# Patient Record
Sex: Female | Born: 1960
Health system: Southern US, Community
[De-identification: ages and names within clinical notes are randomized; demographics above are authoritative.]

## PROBLEM LIST (undated history)

## (undated) DIAGNOSIS — IMO0002 Reserved for concepts with insufficient information to code with codable children: Secondary | ICD-10-CM

## (undated) DIAGNOSIS — Z9889 Other specified postprocedural states: Secondary | ICD-10-CM

## (undated) DIAGNOSIS — R87619 Unspecified abnormal cytological findings in specimens from cervix uteri: Secondary | ICD-10-CM

## (undated) DIAGNOSIS — K219 Gastro-esophageal reflux disease without esophagitis: Secondary | ICD-10-CM

## (undated) DIAGNOSIS — B019 Varicella without complication: Secondary | ICD-10-CM

## (undated) HISTORY — DX: Unspecified abnormal cytological findings in specimens from cervix uteri: R87.619

## (undated) HISTORY — PX: WISDOM TOOTH EXTRACTION: SHX21

## (undated) HISTORY — DX: Other specified postprocedural states: Z98.890

## (undated) HISTORY — DX: Reserved for concepts with insufficient information to code with codable children: IMO0002

## (undated) HISTORY — DX: Varicella without complication: B01.9

## (undated) HISTORY — DX: Gastro-esophageal reflux disease without esophagitis: K21.9

---

## 1994-12-15 HISTORY — PX: COLPOSCOPY: SHX161

## 1999-05-27 ENCOUNTER — Other Ambulatory Visit: Admission: RE | Admit: 1999-05-27 | Discharge: 1999-05-27 | Payer: Self-pay | Admitting: Family Medicine

## 1999-12-16 HISTORY — PX: BREAST SURGERY: SHX581

## 2000-05-28 ENCOUNTER — Other Ambulatory Visit: Admission: RE | Admit: 2000-05-28 | Discharge: 2000-05-28 | Payer: Self-pay | Admitting: Family Medicine

## 2000-12-15 DIAGNOSIS — Z9889 Other specified postprocedural states: Secondary | ICD-10-CM

## 2000-12-15 HISTORY — PX: LASIK: SHX215

## 2000-12-15 HISTORY — DX: Other specified postprocedural states: Z98.890

## 2001-06-14 HISTORY — PX: CERVICAL BIOPSY  W/ LOOP ELECTRODE EXCISION: SUR135

## 2001-06-16 ENCOUNTER — Other Ambulatory Visit: Admission: RE | Admit: 2001-06-16 | Discharge: 2001-06-16 | Payer: Self-pay | Admitting: Family Medicine

## 2001-07-14 ENCOUNTER — Other Ambulatory Visit: Admission: RE | Admit: 2001-07-14 | Discharge: 2001-07-14 | Payer: Self-pay | Admitting: Obstetrics and Gynecology

## 2001-07-14 HISTORY — PX: COLPOSCOPY: SHX161

## 2001-07-19 ENCOUNTER — Encounter: Admission: RE | Admit: 2001-07-19 | Discharge: 2001-07-19 | Payer: Self-pay | Admitting: Family Medicine

## 2001-07-19 ENCOUNTER — Encounter: Payer: Self-pay | Admitting: Family Medicine

## 2001-07-22 ENCOUNTER — Encounter: Payer: Self-pay | Admitting: Family Medicine

## 2001-07-22 ENCOUNTER — Encounter: Admission: RE | Admit: 2001-07-22 | Discharge: 2001-07-22 | Payer: Self-pay | Admitting: Family Medicine

## 2001-08-23 ENCOUNTER — Encounter: Admission: RE | Admit: 2001-08-23 | Discharge: 2001-08-23 | Payer: Self-pay | Admitting: Obstetrics and Gynecology

## 2001-08-23 ENCOUNTER — Encounter: Payer: Self-pay | Admitting: Obstetrics and Gynecology

## 2001-08-23 ENCOUNTER — Encounter: Payer: Self-pay | Admitting: Surgery

## 2001-08-23 ENCOUNTER — Encounter (INDEPENDENT_AMBULATORY_CARE_PROVIDER_SITE_OTHER): Payer: Self-pay | Admitting: Specialist

## 2001-08-23 ENCOUNTER — Ambulatory Visit (HOSPITAL_BASED_OUTPATIENT_CLINIC_OR_DEPARTMENT_OTHER): Admission: RE | Admit: 2001-08-23 | Discharge: 2001-08-23 | Payer: Self-pay | Admitting: Surgery

## 2001-08-30 HISTORY — PX: BREAST EXCISIONAL BIOPSY: SUR124

## 2001-11-10 ENCOUNTER — Other Ambulatory Visit: Admission: RE | Admit: 2001-11-10 | Discharge: 2001-11-10 | Payer: Self-pay | Admitting: Obstetrics and Gynecology

## 2002-02-16 ENCOUNTER — Other Ambulatory Visit: Admission: RE | Admit: 2002-02-16 | Discharge: 2002-02-16 | Payer: Self-pay | Admitting: Obstetrics and Gynecology

## 2002-06-24 ENCOUNTER — Other Ambulatory Visit: Admission: RE | Admit: 2002-06-24 | Discharge: 2002-06-24 | Payer: Self-pay | Admitting: Obstetrics and Gynecology

## 2002-08-25 ENCOUNTER — Encounter: Payer: Self-pay | Admitting: Obstetrics and Gynecology

## 2002-08-25 ENCOUNTER — Encounter: Admission: RE | Admit: 2002-08-25 | Discharge: 2002-08-25 | Payer: Self-pay | Admitting: Obstetrics and Gynecology

## 2002-12-21 ENCOUNTER — Other Ambulatory Visit: Admission: RE | Admit: 2002-12-21 | Discharge: 2002-12-21 | Payer: Self-pay | Admitting: Obstetrics and Gynecology

## 2003-06-26 ENCOUNTER — Other Ambulatory Visit: Admission: RE | Admit: 2003-06-26 | Discharge: 2003-06-26 | Payer: Self-pay | Admitting: Obstetrics and Gynecology

## 2003-08-29 ENCOUNTER — Encounter: Admission: RE | Admit: 2003-08-29 | Discharge: 2003-08-29 | Payer: Self-pay | Admitting: Obstetrics and Gynecology

## 2003-08-29 ENCOUNTER — Encounter: Payer: Self-pay | Admitting: Obstetrics and Gynecology

## 2004-06-26 ENCOUNTER — Other Ambulatory Visit: Admission: RE | Admit: 2004-06-26 | Discharge: 2004-06-26 | Payer: Self-pay | Admitting: Obstetrics and Gynecology

## 2004-08-29 ENCOUNTER — Encounter: Admission: RE | Admit: 2004-08-29 | Discharge: 2004-08-29 | Payer: Self-pay | Admitting: Obstetrics and Gynecology

## 2005-07-30 ENCOUNTER — Other Ambulatory Visit: Admission: RE | Admit: 2005-07-30 | Discharge: 2005-07-30 | Payer: Self-pay | Admitting: Obstetrics and Gynecology

## 2005-09-01 ENCOUNTER — Encounter: Admission: RE | Admit: 2005-09-01 | Discharge: 2005-09-01 | Payer: Self-pay | Admitting: Obstetrics and Gynecology

## 2006-08-21 ENCOUNTER — Other Ambulatory Visit: Admission: RE | Admit: 2006-08-21 | Discharge: 2006-08-21 | Payer: Self-pay | Admitting: Obstetrics and Gynecology

## 2006-09-15 ENCOUNTER — Encounter: Admission: RE | Admit: 2006-09-15 | Discharge: 2006-09-15 | Payer: Self-pay | Admitting: Obstetrics and Gynecology

## 2007-09-03 ENCOUNTER — Other Ambulatory Visit: Admission: RE | Admit: 2007-09-03 | Discharge: 2007-09-03 | Payer: Self-pay | Admitting: Unknown Physician Specialty

## 2007-09-22 ENCOUNTER — Encounter: Admission: RE | Admit: 2007-09-22 | Discharge: 2007-09-22 | Payer: Self-pay | Admitting: Obstetrics and Gynecology

## 2008-09-11 ENCOUNTER — Other Ambulatory Visit: Admission: RE | Admit: 2008-09-11 | Discharge: 2008-09-11 | Payer: Self-pay | Admitting: Obstetrics and Gynecology

## 2008-09-22 ENCOUNTER — Encounter: Admission: RE | Admit: 2008-09-22 | Discharge: 2008-09-22 | Payer: Self-pay | Admitting: Obstetrics and Gynecology

## 2009-09-25 ENCOUNTER — Encounter: Admission: RE | Admit: 2009-09-25 | Discharge: 2009-09-25 | Payer: Self-pay | Admitting: Obstetrics and Gynecology

## 2010-09-26 ENCOUNTER — Encounter: Admission: RE | Admit: 2010-09-26 | Discharge: 2010-09-26 | Payer: Self-pay | Admitting: Obstetrics and Gynecology

## 2011-05-02 NOTE — Op Note (Signed)
[place]  Patient:    Deborah Lang, Deborah Lang Visit Number: [id number] MRN: [id number]          Service Type: DSU Location: [facility] Attending Physician:  Charlton Haws Dictated by:   Currie Paris, M.D. Proc. Date: 08/23/01 Admit Date:  08/23/2001   CC:         Duncan Dull, M.D.  [facility]   Operative Report  ACCOUNT:  [id number]  OFFICE: [id number]  PREOPERATIVE DIAGNOSIS:  Breast calcifications, left.  POSTOPERATIVE DIAGNOSIS:  Breast calcifications, left.  OPERATION:  Needle local excision of left breast calcifications.  SURGEON:  Currie Paris, M.D.  ANESTHESIA:  General.  INDICATIONS:  This patient is a 50 year old with some indeterminate calcifications in the upper outer quadrant of the left breast.  They are not amenable to stereotactic biopsy.  DESCRIPTION OF PROCEDURE:  The patient was seen in the holding area and had had her guide wire placed.  It appeared to be appropriately placed.  It entered laterally in the upper outer quadrant of the left breast.  The patient was taken to the operating room, and after satisfactory general anesthesia was obtained, the breast was prepped and draped.  I made a curvilinear incision just above and medial to the guide wire which was the direction the guide wire was tracking.  I divided a little bit of the subcutaneous tissue, and then manipulated the guide wire into the wound.  I grasped the breast tissue around the guide wire, and using cautery excised a cylinder of tissue around the guide wire until I got beyond the tip.  It was actually fairly superficial in the breast, as it was close to the surface of the breast tissue per se.  Once this area was removed, it was sent for specimen mammography.  While waiting for that, the breast was closed with #3-0 Vicryl, followed by #4-0 Monocryl subcuticular.  I infiltrated a little 0.25% Marcaine to try to help with  postoperative pain relief.  Radiology reported that the calcifications were contained.  Once I had removed those calcifications, I did take another small little area that felt a little bit rough at the edge of the tissue, and sent that with the permanents.  The wound was covered with some Steri-Strips and a sterile dressing.  The patient tolerated the procedure well.  There were no operative complications.  All counts were correct. Dictated by:   Currie Paris, M.D. Attending Physician:  Charlton Haws DD:  08/23/01 TD:  08/23/01 Job: [id number]

## 2011-06-11 LAB — HM COLONOSCOPY

## 2011-08-21 ENCOUNTER — Other Ambulatory Visit: Payer: Self-pay | Admitting: Obstetrics and Gynecology

## 2011-08-21 DIAGNOSIS — Z1231 Encounter for screening mammogram for malignant neoplasm of breast: Secondary | ICD-10-CM

## 2011-10-02 ENCOUNTER — Ambulatory Visit
Admission: RE | Admit: 2011-10-02 | Discharge: 2011-10-02 | Disposition: A | Discharge status: Home / Self Care | Payer: 59 | Source: Ambulatory Visit | Attending: Obstetrics and Gynecology | Admitting: Obstetrics and Gynecology

## 2011-10-02 DIAGNOSIS — Z1231 Encounter for screening mammogram for malignant neoplasm of breast: Secondary | ICD-10-CM

## 2012-08-31 ENCOUNTER — Other Ambulatory Visit: Payer: Self-pay | Admitting: Obstetrics and Gynecology

## 2012-08-31 DIAGNOSIS — Z1231 Encounter for screening mammogram for malignant neoplasm of breast: Secondary | ICD-10-CM

## 2012-10-04 ENCOUNTER — Ambulatory Visit: Payer: 59

## 2012-10-08 ENCOUNTER — Ambulatory Visit
Admission: RE | Admit: 2012-10-08 | Discharge: 2012-10-08 | Disposition: A | Discharge status: Home / Self Care | Payer: 59 | Source: Ambulatory Visit | Attending: Obstetrics and Gynecology | Admitting: Obstetrics and Gynecology

## 2012-10-08 DIAGNOSIS — Z1231 Encounter for screening mammogram for malignant neoplasm of breast: Secondary | ICD-10-CM

## 2012-10-15 LAB — HM PAP SMEAR

## 2013-08-03 ENCOUNTER — Other Ambulatory Visit: Payer: Self-pay | Admitting: Obstetrics and Gynecology

## 2013-09-14 ENCOUNTER — Other Ambulatory Visit: Payer: Self-pay

## 2013-09-14 DIAGNOSIS — Z1231 Encounter for screening mammogram for malignant neoplasm of breast: Secondary | ICD-10-CM

## 2013-10-10 ENCOUNTER — Ambulatory Visit
Admission: RE | Admit: 2013-10-10 | Discharge: 2013-10-10 | Disposition: A | Discharge status: Home / Self Care | Payer: 59 | Source: Ambulatory Visit

## 2013-10-10 DIAGNOSIS — Z1231 Encounter for screening mammogram for malignant neoplasm of breast: Secondary | ICD-10-CM

## 2013-10-21 ENCOUNTER — Ambulatory Visit: Payer: Self-pay | Admitting: Obstetrics and Gynecology

## 2013-10-21 ENCOUNTER — Encounter: Payer: Self-pay | Admitting: Obstetrics and Gynecology

## 2013-10-21 ENCOUNTER — Ambulatory Visit (INDEPENDENT_AMBULATORY_CARE_PROVIDER_SITE_OTHER): Payer: 59 | Admitting: Obstetrics and Gynecology

## 2013-10-21 VITALS — BP 138/80 | HR 76 | Ht 67.5 in | Wt 166.0 lb

## 2013-10-21 DIAGNOSIS — Z23 Encounter for immunization: Secondary | ICD-10-CM

## 2013-10-21 DIAGNOSIS — Z01419 Encounter for gynecological examination (general) (routine) without abnormal findings: Secondary | ICD-10-CM

## 2013-10-21 DIAGNOSIS — Z0000 Encounter for general adult medical examination without abnormal findings: Secondary | ICD-10-CM

## 2013-10-21 LAB — POCT URINALYSIS DIPSTICK
Bilirubin, UA: NEGATIVE
Blood, UA: NEGATIVE
Nitrite, UA: NEGATIVE
Urobilinogen, UA: NEGATIVE

## 2013-10-21 MED ORDER — NORETHINDRONE-ETH ESTRADIOL 1-35 MG-MCG PO TABS: 1.0000 | ORAL_TABLET | Freq: Every day | ORAL | Status: DC

## 2013-10-21 NOTE — Patient Instructions (Addendum)
EXERCISE AND DIET:  We recommended that you start or continue a regular exercise program for good health. Regular exercise means any activity that makes your heart beat faster and makes you sweat.  We recommend exercising at least 30 minutes per day at least 3 days a week, preferably 4 or 5.  We also recommend a diet low in fat and sugar.  Inactivity, poor dietary choices and obesity can cause diabetes, heart attack, stroke, and kidney damage, among others.    ALCOHOL AND SMOKING:  Women should limit their alcohol intake to no more than 7 drinks/beers/glasses of wine (combined, not each!) per week. Moderation of alcohol intake to this level decreases your risk of breast cancer and liver damage. And of course, no recreational drugs are part of a healthy lifestyle.  And absolutely no smoking or even second hand smoke. Most people know smoking can cause heart and lung diseases, but did you know it also contributes to weakening of your bones? Aging of your skin?  Yellowing of your teeth and nails?  CALCIUM AND VITAMIN D:  Adequate intake of calcium and Vitamin D are recommended.  The recommendations for exact amounts of these supplements seem to change often, but generally speaking 600 mg of calcium (either carbonate or citrate) and 800 units of Vitamin D per day seems prudent. Certain women may benefit from higher intake of Vitamin D.  If you are among these women, your doctor will have told you during your visit.    PAP SMEARS:  Pap smears, to check for cervical cancer or precancers,  have traditionally been done yearly, although recent scientific advances have shown that most women can have pap smears less often.  However, every woman still should have a physical exam from her gynecologist every year. It will include a breast check, inspection of the vulva and vagina to check for abnormal growths or skin changes, a visual exam of the cervix, and then an exam to evaluate the size and shape of the uterus and  ovaries.  And after 53 years of age, a rectal exam is indicated to check for rectal cancers. We will also provide age appropriate advice regarding health maintenance, like when you should have certain vaccines, screening for sexually transmitted diseases, bone density testing, colonoscopy, mammograms, etc.   MAMMOGRAMS:  All women over 40 years old should have a yearly mammogram. Many facilities now offer a "3D" mammogram, which may cost around $50 extra out of pocket. If possible,  we recommend you accept the option to have the 3D mammogram performed.  It both reduces the number of women who will be called back for extra views which then turn out to be normal, and it is better than the routine mammogram at detecting truly abnormal areas.    COLONOSCOPY:  Colonoscopy to screen for colon cancer is recommended for all women at age 50.  We know, you hate the idea of the prep.  We agree, BUT, having colon cancer and not knowing it is worse!!  Colon cancer so often starts as a polyp that can be seen and removed at colonscopy, which can quite literally save your life!  And if your first colonoscopy is normal and you have no family history of colon cancer, most women don't have to have it again for 10 years.  Once every ten years, you can do something that may end up saving your life, right?  We will be happy to help you get it scheduled when you are ready.    Be sure to check your insurance coverage so you understand how much it will cost.  It may be covered as a preventative service at no cost, but you should check your particular policy.     Tetanus, Diphtheria (Td) Vaccine What You Need to Know WHY GET VACCINATED? Tetanus  and diphtheria are very serious diseases. They are rare in the [place] today, but people who do become infected often have severe complications. Td vaccine is used to protect adolescents and adults from both of these diseases. Both tetanus and diphtheria are infections caused by  bacteria. Diphtheria spreads from person to person through coughing or sneezing. Tetanus-causing bacteria enter the body through cuts, scratches, or wounds. TETANUS (Lockjaw) causes painful muscle tightening and stiffness, usually all over the body.  It can lead to tightening of muscles in the head and neck so you can't open your mouth, swallow, or sometimes even breathe. Tetanus kills about 1 out of every 5 people who are infected. DIPHTHERIA can cause a thick coating to form in the back of the throat.  It can lead to breathing problems, paralysis, heart failure, and death. Before vaccines, the [place] saw as many as 200,000 cases a year of diphtheria and hundreds of cases of tetanus. Since vaccination began, cases of both diseases have dropped by about 99%. TD VACCINE Td vaccine can protect adolescents and adults from tetanus and diphtheria. Td is usually given as a booster dose every 10 years but it can also be given earlier after a severe and dirty wound or burn. Your doctor can give you more information. Td may safely be given at the same time as other vaccines. SOME PEOPLE SHOULD NOT GET THIS VACCINE  If you ever had a life-threatening allergic reaction after a dose of any tetanus or diphtheria containing vaccine, OR if you have a severe allergy to any part of this vaccine, you should not get Td. Tell your doctor if you have any severe allergies.  Talk to your doctor if you:  have epilepsy or another nervous system problem,  had severe pain or swelling after any vaccine containing diphtheria or tetanus,  ever had Guillain Barr Syndrome (GBS),  aren't feeling well on the day the shot is scheduled. RISKS OF A VACCINE REACTION With a vaccine, like any medicine, there is a chance of side effects. These are usually mild and go away on their own. Serious side effects are also possible, but are very rare. Most people who get Td vaccine do not have any problems with it. Mild  Problems  following Td (Did not interfere with activities)  Pain where the shot was given (about 8 people in 10)  Redness or swelling where the shot was given (about 1 person in 3)  Mild fever (about 1 person in 15)  Headache or Tiredness (uncommon) Moderate Problems following Td (Interfered with activities, but did not require medical attention)  Fever over 102 F (38.9 C) (rare) Severe Problems  following Td (Unable to perform usual activities; required medical attention)  Swelling, severe pain, bleeding, or redness in the arm where the shot was given (rare). Problems that could happen after any vaccine:  Brief fainting spells can happen after any medical procedure, including vaccination. Sitting or lying down for about 15 minutes can help prevent fainting, and injuries caused by a fall. Tell your doctor if you feel dizzy, or have vision changes or ringing in the ears.  Severe shoulder pain and reduced range of motion in the   arm where a shot was given can happen, very rarely, after a vaccination.  Severe allergic reactions from a vaccine are very rare, estimated at less than 1 in a million doses. If one were to occur, it would usually be within a few minutes to a few hours after the vaccination. WHAT IF THERE IS A SERIOUS REACTION? What should I look for?  Look for anything that concerns you, such as signs of a severe allergic reaction, very high fever, or behavior changes. Signs of a severe allergic reaction can include hives, swelling of the face and throat, difficulty breathing, a fast heartbeat, dizziness, and weakness. These would usually start a few minutes to a few hours after the vaccination. What should I do?  If you think it is a severe allergic reaction or other emergency that can't wait, call 911 or get the person to the nearest hospital. Otherwise, call your doctor.  Afterward, the reaction should be reported to the Vaccine Adverse Event Reporting System (VAERS).  Your doctor might file this report, or, you can do it yourself through the VAERS website or by calling 1-800-822-7967. VAERS is only for reporting reactions. They do not give medical advice. THE NATIONAL VACCINE INJURY COMPENSATION PROGRAM The National Vaccine Injury Compensation Program (VICP) is a federal program that was created to compensate people who may have been injured by certain vaccines. Persons who believe they may have been injured by a vaccine can learn about the program and about filing a claim by calling 1-800-338-2382 or visiting the VICP website. HOW CAN I LEARN MORE?  Ask your doctor.  Contact your local or state health department.  Contact the Centers for Disease Control and Prevention (CDC):  Call 1-800-232-4636 (1-800-CDC-INFO)  Visit CDC's vaccines website CDC Td Vaccine Interim VIS (01/18/13) Document Released: 09/28/2006 Document Revised: 03/28/2013 Document Reviewed: 03/23/2013 ExitCare Patient Information 2014 [employer].  

## 2013-10-21 NOTE — Progress Notes (Addendum)
Patient ID: Deborah Lang, female   DOB: 1961/06/10, 52 y.o.   MRN: [id number] GYNECOLOGY VISIT  PCP:   None  Referring provider:   HPI: 52 y.o.   Single  Caucasian  female   G0P0 with Patient's last menstrual period was 10/18/2013.   here for   AEX. Not sexually active but on OCPs to relieve cramping.  Skips a menses and then has several in a row at the usual monthly time.  No hot flashes or night sweats.  Hgb:    14.2 Urine:   1+ WBC's - Asymptomatic.   Menses is just finishing. No history of urinary tract infections.   GYNECOLOGIC HISTORY: Patient's last menstrual period was 10/18/2013. Sexually active:  no Partner preference: female Contraception:  OCP's  Menopausal hormone therapy: no DES exposure:   no Blood transfusions:   no Sexually transmitted diseases:  no GYN Procedures:  Colposcopy 1996 and 2002 for CIN III.  LEEP procedure 06/2001 Mammogram:  10-13-13 [facility]              Pap:  10-15-12 wnl:no HR HPV done  History of abnormal pap smear:  Hx dysplasia with colposcopy  In 1996 and hx of CIN III with LEEP procedure 06/2001.   OB History   Grav Para Term Preterm Abortions TAB SAB Ect Mult Living   0                LIFESTYLE: Exercise:      Spinning ,walking and yoga         Tobacco:       no Alcohol:          Maybe 1 drink per week Drug use:       no  OTHER HEALTH MAINTENANCE: Tetanus/TDap:    2004 Gardisil:  NA Influenza:    Yearly with employer Zostavax:  NA  Bone density:  NA Colonoscopy:  2012 wnl with Dr. Loreta Ave.  Next colonoscopy due 2022.  Cholesterol check:  2013 wnl   Family History  Problem Relation Age of Onset  . Breast cancer Maternal Aunt   . Osteoarthritis Mother   . Stroke Father     There are no active problems to display for this patient.  Past Medical History  Diagnosis Date  . Abnormal Pap smear     Past Surgical History  Procedure Laterality Date  . Cervical biopsy  w/ loop electrode excision  06/2001    CIN  III  . Wisdom tooth extraction    . Lasik  2002  . Colposcopy  07-14-2001    CIN III  . Colposcopy  1996    dysplasia--colposcopy normal  . Breast surgery  2001    benign tumor removed left breast    ALLERGIES: Review of patient's allergies indicates no known allergies.  Current Outpatient Prescriptions  Medication Sig Dispense Refill  . calcium carbonate (OS-CAL) 600 MG TABS tablet Take 600 mg by mouth 2 (two) times daily with a meal.      . fish oil-omega-3 fatty acids 1000 MG capsule Take 2 g by mouth daily.      . norethindrone-ethinyl estradiol 1/35 (ORTHO-NOVUM, NORTREL,CYCLAFEM) tablet Take 1 tablet by mouth daily.       No current facility-administered medications for this visit.     ROS:  Pertinent items are noted in HPI.  SOCIAL HISTORY:  Works for [employer] [place].   PHYSICAL EXAMINATION:    BP 138/80  Pulse 76  Ht 5' 7.5" (  1.715 m)  Wt 166 lb (75.297 kg)  BMI 25.60 kg/m2  LMP 10/18/2013  Repeat blood pressure - 125/85 Wt Readings from Last 3 Encounters:  10/21/13 166 lb (75.297 kg)     Ht Readings from Last 3 Encounters:  10/21/13 5' 7.5" (1.715 m)    General appearance: alert, cooperative and appears stated age Head: Normocephalic, without obvious abnormality, atraumatic Neck: no adenopathy, supple, symmetrical, trachea midline and thyroid not enlarged, symmetric, no tenderness/mass/nodules Lungs: clear to auscultation bilaterally Breasts: Inspection negative, No nipple retraction or dimpling, No nipple discharge or bleeding, No axillary or supraclavicular adenopathy, Normal to palpation without dominant masses Heart: regular rate and rhythm Abdomen: soft, non-tender; no masses,  no organomegaly Extremities: extremities normal, atraumatic, no cyanosis or edema Skin: Skin color, texture, turgor normal. No rashes or lesions Lymph nodes: Cervical, supraclavicular, and axillary nodes normal. No abnormal inguinal nodes palpated Neurologic: Grossly  normal  Pelvic: External genitalia:  no lesions              Urethra:  normal appearing urethra with no masses, tenderness or lesions              Bartholins and Skenes: normal                 Vagina: normal appearing vagina with normal color and discharge, no lesions              Cervix: normal appearance              Pap and high risk HPV testing done: yes.            Bimanual Exam:  Uterus:  uterus is normal size, shape, consistency and nontender                                      Adnexa: normal adnexa in size, nontender and no masses                                      Rectovaginal: Confirms                                      Anus:  normal sphincter tone, no lesions  ASSESSMENT  Normal gynecologic exam. History of LEEP for CIN 3 in 2002. WBCs in urine.  Asymptomatic.   PLAN  Mammogram yearly.  Pap smear and high risk HPV testing performed.  Refill of OCPs.  See Epic.  Next year will reassess usage and if needs to continue, will consider reduction of dosage. Tdap.   No urine culture today.  Return annually or prn   An After Visit Summary was printed and given to the patient.

## 2014-03-06 ENCOUNTER — Encounter: Payer: Self-pay | Admitting: Internal Medicine

## 2014-03-06 ENCOUNTER — Ambulatory Visit (INDEPENDENT_AMBULATORY_CARE_PROVIDER_SITE_OTHER): Payer: 59 | Admitting: Internal Medicine

## 2014-03-06 VITALS — BP 138/82 | HR 70 | Temp 97.8°F | Ht 67.75 in | Wt 164.0 lb

## 2014-03-06 DIAGNOSIS — Z0000 Encounter for general adult medical examination without abnormal findings: Secondary | ICD-10-CM

## 2014-03-06 LAB — CBC WITH DIFFERENTIAL/PLATELET
BASOS ABS: 0 10*3/uL (ref 0.0–0.1)
Basophils Relative: 0.2 % (ref 0.0–3.0)
EOS ABS: 0.1 10*3/uL (ref 0.0–0.7)
EOS PCT: 1 % (ref 0.0–5.0)
HEMATOCRIT: 39.8 % (ref 36.0–46.0)
Hemoglobin: 13.2 g/dL (ref 12.0–15.0)
LYMPHS PCT: 24.6 % (ref 12.0–46.0)
Lymphs Abs: 1.6 10*3/uL (ref 0.7–4.0)
MCHC: 33.2 g/dL (ref 30.0–36.0)
MCV: 92.5 fl (ref 78.0–100.0)
Monocytes Absolute: 0.4 10*3/uL (ref 0.1–1.0)
Monocytes Relative: 6.7 % (ref 3.0–12.0)
NEUTROS ABS: 4.4 10*3/uL (ref 1.4–7.7)
NEUTROS PCT: 67.5 % (ref 43.0–77.0)
PLATELETS: 237 10*3/uL (ref 150.0–400.0)
RBC: 4.3 Mil/uL (ref 3.87–5.11)
RDW: 12.9 % (ref 11.5–14.6)
WBC: 6.5 10*3/uL (ref 4.5–10.5)

## 2014-03-06 LAB — BASIC METABOLIC PANEL
BUN: 15 mg/dL (ref 6–23)
CALCIUM: 8.9 mg/dL (ref 8.4–10.5)
CO2: 26 mEq/L (ref 19–32)
CREATININE: 0.9 mg/dL (ref 0.4–1.2)
Chloride: 105 mEq/L (ref 96–112)
GFR: 73.09 mL/min (ref >60.00)
Glucose, Bld: 89 mg/dL (ref 70–99)
Potassium: 4.1 mEq/L (ref 3.5–5.1)
SODIUM: 138 meq/L (ref 135–145)

## 2014-03-06 LAB — LIPID PANEL
CHOL/HDL RATIO: 3
Cholesterol: 192 mg/dL (ref 0–200)
HDL: 65.3 mg/dL (ref >39.00)
LDL Cholesterol: 105 mg/dL — ABNORMAL HIGH (ref 0–99)
Triglycerides: 108 mg/dL (ref 0.0–149.0)
VLDL: 21.6 mg/dL (ref 0.0–40.0)

## 2014-03-06 LAB — HEPATIC FUNCTION PANEL
ALT: 15 U/L (ref 0–35)
AST: 21 U/L (ref 0–37)
Albumin: 3.8 g/dL (ref 3.5–5.2)
Alkaline Phosphatase: 46 U/L (ref 39–117)
BILIRUBIN DIRECT: 0.1 mg/dL (ref 0.0–0.3)
Total Bilirubin: 0.8 mg/dL (ref 0.3–1.2)
Total Protein: 7 g/dL (ref 6.0–8.3)

## 2014-03-06 LAB — TSH: TSH: 1.27 u[IU]/mL (ref 0.35–5.50)

## 2014-03-06 NOTE — Patient Instructions (Signed)
Continue lifestyle intervention healthy eating and exercise . 150 minutes of exercise weeks  ,  Lose weight  To healthy levels. Avoid trans fats and processed foods;  Increase fresh fruits and veges to 5 servings per day. And avoid sweet beverages  Including tea and juice. Will notify you  of labs when available. i fdoing well can plan wellness visit in  1-2 years . But keep up with screening through your GYNE as discussed .

## 2014-03-06 NOTE — Progress Notes (Signed)
Chief Complaint  Patient presents with  . Establish Care    HPI: Patient comes in as new patient visit . Previous care was via gne and needs pcp Periods   Control .  So on hormonal therapy at this time  From [place] but in the area for years . No major injuries or diseases otherwise   ROS:  GEN/ HEENT: No fever, significant weight changes sweats headaches vision problems hearing changes, CV/ PULM; No chest pain shortness of breath cough, syncope,edema  change in exercise tolerance. GI /GU: No adominal pain, vomiting, change in bowel habits. No blood in the stool. No significant GU symptoms. SKIN/HEME: ,no acute skin rashes suspicious lesions or bleeding. No lymphadenopathy, nodules, masses.  NEURO/ PSYCH:  No neurologic signs such as weakness numbness. No depression anxiety. IMM/ Allergy: No unusual infections.  Allergy .   REST of 12 system review negative except as per HPI   Past Medical History  Diagnosis Date  . Abnormal Pap smear   . GERD (gastroesophageal reflux disease)   . Chicken pox   . HX: benign breast biopsy 2002    Family History  Problem Relation Age of Onset  . Breast cancer Maternal Aunt   . Cancer Maternal Aunt     Breast Cancer  . Osteoarthritis Mother   . Stroke Father [id number]  . Arthritis Father     History   Social History  . Marital Status: Single    Spouse Name: N/A    Number of Children: N/A  . Years of Education: N/A   Social History Main Topics  . Smoking status: Never Smoker   . Smokeless tobacco: Never Used  . Alcohol Use: 3.5 oz/week    7 drink(s) per week     Comment: occ  . Drug Use: No  . Sexual Activity: No     Comment: Nortrel 1/35   Other Topics Concern  . 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   Masters level educ   G0P0   orig from [place]       Outpatient Encounter Prescriptions as of 03/06/2014  Medication Sig  . calcium carbonate (OS-CAL) 600 MG TABS tablet Take 600 mg by mouth 2 (two) times daily with a meal.  . fish oil-omega-3 fatty acids 1000 MG capsule Take 2 g by mouth daily.  . norethindrone-ethinyl estradiol 1/35 (ORTHO-NOVUM, NORTREL,CYCLAFEM) tablet Take 1 tablet by mouth daily.    EXAM:  BP 138/82  Pulse 70  Temp(Src) 97.8 F (36.6 C) (Oral)  Ht 5' 7.75" (1.721 m)  Wt 164 lb (74.39 kg)  BMI 25.12 kg/m2  SpO2 98%  LMP 02/06/2014  Body mass index is 25.12 kg/(m^2). Physical Exam: Vital signs reviewed [id number]:YNWGGEN:This is a well-developed well-nourished alert cooperative  female who appears her stated age in no acute distress.  HEENT: normocephalic atraumatic , Eyes: PERRL EOM's full, conjunctiva clear, Nares: paten,t no deformity discharge or tenderness., Ears: no deformity EAC's clear TMs with normal landmarks. Mouth: clear OP, no lesions, edema.  Moist mucous membranes. Dentition in adequate repair. NECK: supple without masses, thyromegaly or bruits. CHEST/PULM:  Clear to auscultation and percussion breath sounds equal no wheeze , rales or rhonchi. No chest wall deformities  or tenderness. CV: PMI is nondisplaced, S1 S2 no gallops, murmurs, rubs. Peripheral pulses are full without delay.No JVD .  ABDOMEN: Bowel sounds normal nontender  No guard or rebound, no hepato splenomegal no CVA tenderness.  No hernia. Extremtities:  No clubbing cyanosis or edema, no acute joint swelling or redness no focal atrophy NEURO:  Oriented x3, cranial nerves 3-12 appear to be intact, no obvious focal weakness,gait within normal limits no abnormal reflexes or asymmetrical SKIN: No acute rashes normal turgor, color, no bruising or petechiae. PSYCH: Oriented, good eye contact, no obvious depression anxiety, cognition and judgment appear normal. LN: no cervical axillary inguinal adenopathy   ASSESSMENT AND PLAN:  Discussed the  following assessment and plan:  Visit for preventive health examination - healhty hcm discussed  - Plan: Basic metabolic panel, CBC with Differential, Hepatic function panel, Lipid panel, TSH, Hepatitis C antibody  -Patient advised to return or notify health care team  if symptoms worsen ,persist or new concerns arise.  Patient Instructions  Continue lifestyle intervention healthy eating and exercise . 150 minutes of exercise weeks  ,  Lose weight  To healthy levels. Avoid trans fats and processed foods;  Increase fresh fruits and veges to 5 servings per day. And avoid sweet beverages  Including tea and juice. Will notify you  of labs when available. i fdoing well can plan wellness visit in  1-2 years . But keep up with screening through your GYNE as discussed .    Neta Mends. Arturo Sofranko M.D.  Pre visit review using our clinic review tool, if applicable. No additional management support is needed unless otherwise documented below in the visit note.

## 2014-03-07 LAB — HEPATITIS C ANTIBODY: HCV AB: NEGATIVE

## 2014-03-08 ENCOUNTER — Encounter: Payer: Self-pay | Admitting: Internal Medicine

## 2014-03-08 DIAGNOSIS — Z0000 Encounter for general adult medical examination without abnormal findings: Secondary | ICD-10-CM | POA: Insufficient documentation

## 2014-03-08 NOTE — Assessment & Plan Note (Signed)
utd continue healthy ls

## 2014-03-12 ENCOUNTER — Encounter: Payer: Self-pay | Admitting: Internal Medicine

## 2014-08-10 ENCOUNTER — Other Ambulatory Visit: Payer: Self-pay | Admitting: Obstetrics and Gynecology

## 2014-08-10 NOTE — Telephone Encounter (Signed)
Last AEX: 10/21/13 Last refill:10/21/13 #3 packs X 3 Current AEX:10/23/14  Ok to send refill until pt AEX in Nov?

## 2014-08-22 ENCOUNTER — Encounter: Payer: Self-pay | Admitting: Obstetrics and Gynecology

## 2014-09-27 ENCOUNTER — Other Ambulatory Visit: Payer: Self-pay

## 2014-09-27 DIAGNOSIS — Z1231 Encounter for screening mammogram for malignant neoplasm of breast: Secondary | ICD-10-CM

## 2014-10-12 ENCOUNTER — Ambulatory Visit
Admission: RE | Admit: 2014-10-12 | Discharge: 2014-10-12 | Disposition: A | Discharge status: Home / Self Care | Payer: 59 | Source: Ambulatory Visit

## 2014-10-12 DIAGNOSIS — Z1231 Encounter for screening mammogram for malignant neoplasm of breast: Secondary | ICD-10-CM

## 2014-10-23 ENCOUNTER — Ambulatory Visit: Payer: 59 | Admitting: Obstetrics and Gynecology

## 2014-10-25 ENCOUNTER — Ambulatory Visit (INDEPENDENT_AMBULATORY_CARE_PROVIDER_SITE_OTHER): Payer: 59 | Admitting: Obstetrics and Gynecology

## 2014-10-25 ENCOUNTER — Encounter: Payer: Self-pay | Admitting: Obstetrics and Gynecology

## 2014-10-25 VITALS — BP 116/74 | HR 68 | Resp 18 | Ht 67.5 in | Wt 165.0 lb

## 2014-10-25 DIAGNOSIS — Z01419 Encounter for gynecological examination (general) (routine) without abnormal findings: Secondary | ICD-10-CM

## 2014-10-25 DIAGNOSIS — Z0000 Encounter for general adult medical examination without abnormal findings: Secondary | ICD-10-CM

## 2014-10-25 DIAGNOSIS — R8290 Unspecified abnormal findings in urine: Secondary | ICD-10-CM

## 2014-10-25 DIAGNOSIS — N915 Oligomenorrhea, unspecified: Secondary | ICD-10-CM

## 2014-10-25 NOTE — Patient Instructions (Signed)

## 2014-10-25 NOTE — Progress Notes (Signed)
Patient ID: Deborah Lang, female   DOB: 10/14/1961, 53 y.o.   MRN: [id number] 53 y.o. G0P0 Single CaucasianF here for annual exam.   Still on OCPs.   PCP: Deborah AndreasWanda Panosh, MD  Patient's last menstrual period was 10/18/2014.          Sexually active: No.  The current method of family planning is OCP (estrogen/progesterone).    Exercising: Yes.    cycle, yoga , walk  1x/wk Smoker:  no  Health Maintenance: Pap:  10-21-13 wnl:neg HR HPV History of abnormal Pap:  Yes, Hx dysplasia with colposcopy in 1996.  Hx of CIN III with LEEP procedure in 06/2001. MMG: 10/13/14  Extremely dense/wnl: [facility]  Colonoscopy: 2012 wnl Deborah Lang.  Next colonoscopy due 2022. BMD:   Never had one  TDaP:  10-21-13 Screening Labs: No done by PCP, Urine today: Leuks 2   reports that she has never smoked. She has never used smokeless tobacco. She reports that she drinks about 3.5 oz of alcohol per week. She reports that she does not use illicit drugs.  Past Medical History  Diagnosis Date  . Abnormal Pap smear   . GERD (gastroesophageal reflux disease)   . Chicken pox   . HX: benign breast biopsy 2002    Past Surgical History  Procedure Laterality Date  . Cervical biopsy  w/ loop electrode excision  06/2001    CIN III  . Wisdom tooth extraction    . Lasik  2002  . Colposcopy  07-14-2001    CIN III  . Colposcopy  1996    dysplasia--colposcopy normal  . Breast surgery  2001    benign tumor removed left breast    Current Outpatient Prescriptions  Medication Sig Dispense Refill  . calcium carbonate (OS-CAL) 600 MG TABS tablet Take 600 mg by mouth 2 (two) times daily with a meal.    . CYCLAFEM 1/35 tablet TAKE 1 TABLET DAILY 3 Package 0  . fish oil-omega-3 fatty acids 1000 MG capsule Take 2 g by mouth daily.    . Multiple Vitamins-Minerals (MULTIVITAMIN PO) Take by mouth.     No current facility-administered medications for this visit.    Family History  Problem Relation Age of Onset  .  Breast cancer Maternal Aunt   . Cancer Maternal Aunt     Breast Cancer  . Osteoarthritis Mother   . Stroke Father [id number]  . Arthritis Father     ROS:  Pertinent items are noted in HPI.  Otherwise, a comprehensive ROS was negative.  Exam:   Ht 5' 7.5" (1.715 m)  Wt 165 lb (74.844 kg)  BMI 25.45 kg/m2  LMP 10/18/2014     Height: 5' 7.5" (171.5 cm)  Ht Readings from Last 3 Encounters:  10/25/14 5' 7.5" (1.715 m)  03/06/14 5' 7.75" (1.721 m)  10/21/13 5' 7.5" (1.715 m)    General appearance: alert, cooperative and appears stated age Head: Normocephalic, without obvious abnormality, atraumatic Neck: no adenopathy, supple, symmetrical, trachea midline and thyroid normal to inspection and palpation Lungs: clear to auscultation bilaterally Breasts: normal appearance, no masses or tenderness, Inspection negative, No nipple retraction or dimpling, No nipple discharge or bleeding, No axillary or supraclavicular adenopathy Heart: regular rate and rhythm Abdomen: soft, non-tender; bowel sounds normal; no masses,  no organomegaly Extremities: extremities normal, atraumatic, no cyanosis or edema Skin: Skin color, texture, turgor normal. No rashes or lesions Lymph nodes: Cervical, supraclavicular, and axillary nodes normal. No abnormal inguinal nodes palpated  Neurologic: Grossly normal   Pelvic: External genitalia:  no lesions              Urethra:  normal appearing urethra with no masses, tenderness or lesions              Bartholins and Skenes: normal                 Vagina: normal appearing vagina with normal color and discharge, no lesions              Cervix: no lesions              Pap taken: Yes.   Bimanual Exam:  Uterus:  normal size, contour, position, consistency, mobility, non-tender              Adnexa: normal adnexa and no mass, fullness, tenderness               Rectovaginal: Confirms               Anus:  normal sphincter tone, no lesions  A:  Well Woman with normal  exam Dense breasts.  History of LEEP for CIN III.  Leukocytes in urine.   P:   Mammogram yearly.  I recommend 3D pap smear and HR HPV testing.  Urine micro and culture.  Will stop OCPs at the end of this pack and then check Deborah Lang and estradiol 2 weeks later to see if can stop OCPs.  return annually or prn  An After Visit Summary was printed and given to the patient.

## 2014-10-26 ENCOUNTER — Encounter: Payer: Self-pay | Admitting: Obstetrics and Gynecology

## 2014-10-27 LAB — URINALYSIS, MICROSCOPIC ONLY
BACTERIA UA: NONE SEEN
Casts: NONE SEEN

## 2014-10-27 LAB — URINE CULTURE

## 2014-10-30 LAB — IPS PAP TEST WITH HPV

## 2014-11-06 ENCOUNTER — Other Ambulatory Visit: Payer: Self-pay | Admitting: Obstetrics and Gynecology

## 2014-11-06 NOTE — Telephone Encounter (Signed)
Last refilled: 08/10/14 # 3 packs/0 rfs Last AEX: 10/25/14 with Dr. Edward JollySilva AEX scheduled for 11/01/15 with Dr.Silva  Accoridng to AEX note from 10/25/14 patient was to stop ocp's with last pap and then have labs to check fsh and estradiol level.  Patient is scheduled for lab appointment for 11/30/14   Denied rx through our system.

## 2014-11-30 ENCOUNTER — Other Ambulatory Visit: Payer: 59

## 2014-11-30 ENCOUNTER — Other Ambulatory Visit (INDEPENDENT_AMBULATORY_CARE_PROVIDER_SITE_OTHER): Payer: 59

## 2014-11-30 DIAGNOSIS — N915 Oligomenorrhea, unspecified: Secondary | ICD-10-CM

## 2014-12-01 LAB — FOLLICLE STIMULATING HORMONE: FSH: 38.3 m[IU]/mL

## 2014-12-01 LAB — ESTRADIOL: Estradiol: 13.2 pg/mL

## 2015-08-16 ENCOUNTER — Telehealth: Payer: Self-pay | Admitting: Obstetrics and Gynecology

## 2015-08-16 NOTE — Telephone Encounter (Signed)
LMTCB about canceled appt °

## 2015-09-21 ENCOUNTER — Other Ambulatory Visit: Payer: Self-pay

## 2015-09-21 DIAGNOSIS — Z1231 Encounter for screening mammogram for malignant neoplasm of breast: Secondary | ICD-10-CM

## 2015-10-16 ENCOUNTER — Ambulatory Visit
Admission: RE | Admit: 2015-10-16 | Discharge: 2015-10-16 | Disposition: A | Discharge status: Home / Self Care | Payer: 59 | Source: Ambulatory Visit

## 2015-10-16 DIAGNOSIS — Z1231 Encounter for screening mammogram for malignant neoplasm of breast: Secondary | ICD-10-CM

## 2015-11-01 ENCOUNTER — Encounter: Payer: Self-pay | Admitting: Obstetrics and Gynecology

## 2015-11-01 ENCOUNTER — Ambulatory Visit (INDEPENDENT_AMBULATORY_CARE_PROVIDER_SITE_OTHER): Payer: 59 | Admitting: Obstetrics and Gynecology

## 2015-11-01 VITALS — BP 116/60 | HR 80 | Resp 14 | Ht 67.5 in | Wt 178.4 lb

## 2015-11-01 DIAGNOSIS — Z01419 Encounter for gynecological examination (general) (routine) without abnormal findings: Secondary | ICD-10-CM | POA: Diagnosis not present

## 2015-11-01 NOTE — Patient Instructions (Signed)

## 2015-11-01 NOTE — Progress Notes (Signed)
Patient ID: Deborah Lang, female   DOB: 05/19/1961, 54 y.o.   MRN: [id number] 54 y.o. G0P0 Single Caucasian female here for annual exam.    Stopped OCPs after her visit with me last year and had hormonal testing following this showing FSH  38.3 and Estradiol  13.2. No menses since then.  No hot flashes.  Some weight gain.  Increased hair growth on face.  Asking about hormones and if she needs to take them.   PCP:  Berniece Andreas, MD - labs with PCP in April.   Patient's last menstrual period was 10/15/2014 (approximate).          Sexually active: No.  The current method of family planning is post menopausal status.    Exercising: Yes.    spin class, yoga and walking Smoker:  no  Health Maintenance: Pap:  10-26-14 Neg:Neg HR HPV History of abnormal Pap:  Yes, Hx of dysplasia and colposcopy 1996; 06/2001 LEEP procedure with CIN III. MMG:  10-16-15 3D Density Cat.C/Neg/BiRads1:[facility]. Colonoscopy:  2012 normal Dr. Thelma Comp due 2022. BMD:   n/a  Result  n/a TDaP:  10/2013 Screening Labs:  Hb today: PCP, Urine today: PCP   reports that she has never smoked. She has never used smokeless tobacco. She reports that she drinks about 0.6 oz of alcohol per week. She reports that she does not use illicit drugs.  Past Medical History  Diagnosis Date  . Abnormal Pap smear   . GERD (gastroesophageal reflux disease)   . Chicken pox   . HX: benign breast biopsy 2002    Past Surgical History  Procedure Laterality Date  . Cervical biopsy  w/ loop electrode excision  06/2001    CIN III  . Wisdom tooth extraction    . Lasik  2002  . Colposcopy  07-14-2001    CIN III  . Colposcopy  1996    dysplasia--colposcopy normal  . Breast surgery  2001    benign tumor removed left breast    Current Outpatient Prescriptions  Medication Sig Dispense Refill  . fish oil-omega-3 fatty acids 1000 MG capsule Take 2 g by mouth daily.    . Multiple Vitamins-Minerals (MULTIVITAMIN PO) Take by mouth.      No current facility-administered medications for this visit.    Family History  Problem Relation Age of Onset  . Breast cancer Maternal Aunt   . Cancer Maternal Aunt     Breast Cancer  . Osteoarthritis Mother   . Stroke Father 23  . Arthritis Father     ROS:  Pertinent items are noted in HPI.  Otherwise, a comprehensive ROS was negative.  Exam:   BP 116/60 mmHg  Pulse 80  Resp 14  Ht 5' 7.5" (1.715 m)  Wt 178 lb 6.4 oz (80.922 kg)  BMI 27.51 kg/m2  LMP 10/15/2014 (Approximate)    General appearance: alert, cooperative and appears stated age Head: Normocephalic, without obvious abnormality, atraumatic Neck: no adenopathy, supple, symmetrical, trachea midline and thyroid normal to inspection and palpation Lungs: clear to auscultation bilaterally Breasts: normal appearance, no masses or tenderness, Inspection negative, No nipple retraction or dimpling, No nipple discharge or bleeding, No axillary or supraclavicular adenopathy, scar of left breast in upper outer quadrant.  Heart: regular rate and rhythm Abdomen: soft, non-tender; bowel sounds normal; no masses,  no organomegaly Extremities: extremities normal, atraumatic, no cyanosis or edema Skin: Skin color, texture, turgor normal. No rashes or lesions Lymph nodes: Cervical, supraclavicular, and axillary nodes  normal. No abnormal inguinal nodes palpated Neurologic: Grossly normal  Pelvic: External genitalia:  no lesions              Urethra:  normal appearing urethra with no masses, tenderness or lesions              Bartholins and Skenes: normal                 Vagina: normal appearing vagina with normal color and discharge, no lesions              Cervix: no lesions              Pap taken: Yes.   Bimanual Exam:  Uterus:  normal size, contour, position, consistency, mobility, non-tender              Adnexa: normal adnexa and no mass, fullness, tenderness              Rectovaginal: Yes.  .  Confirms.               Anus:  normal sphincter tone, no lesions  Chaperone was present for exam.  Assessment:   Well woman visit with normal exam. Hx of recurrent dysplasia.  Status post LEEP for CIN III. Menopausal female.  Plan: Yearly mammogram recommended after age 55.  Recommended self breast exam.  Pap and HR HPV as above. Discussed Calcium, Vitamin D, regular exercise program including cardiovascular and weight bearing exercise. Labs performed.  No..    Will do routine labs with PCP. Refills given on medications.  No..    We discussed HRT - indications, risks and benefits.  No HRT needed. Follow up annually and prn.      After visit summary provided.

## 2015-11-02 ENCOUNTER — Encounter: Payer: Self-pay | Admitting: Obstetrics and Gynecology

## 2015-11-05 LAB — IPS PAP TEST WITH HPV

## 2016-03-14 ENCOUNTER — Other Ambulatory Visit (INDEPENDENT_AMBULATORY_CARE_PROVIDER_SITE_OTHER): Payer: 59

## 2016-03-14 DIAGNOSIS — Z0000 Encounter for general adult medical examination without abnormal findings: Secondary | ICD-10-CM

## 2016-03-14 LAB — LIPID PANEL
CHOL/HDL RATIO: 3
CHOLESTEROL: 201 mg/dL — AB (ref 0–200)
HDL: 67.7 mg/dL (ref >39.00)
LDL Cholesterol: 115 mg/dL — ABNORMAL HIGH (ref 0–99)
NonHDL: 132.81
TRIGLYCERIDES: 89 mg/dL (ref 0.0–149.0)
VLDL: 17.8 mg/dL (ref 0.0–40.0)

## 2016-03-14 LAB — HEPATIC FUNCTION PANEL
ALBUMIN: 4.2 g/dL (ref 3.5–5.2)
ALK PHOS: 97 U/L (ref 39–117)
ALT: 22 U/L (ref 0–35)
AST: 22 U/L (ref 0–37)
Bilirubin, Direct: 0.1 mg/dL (ref 0.0–0.3)
Total Bilirubin: 0.6 mg/dL (ref 0.2–1.2)
Total Protein: 6.9 g/dL (ref 6.0–8.3)

## 2016-03-14 LAB — BASIC METABOLIC PANEL
BUN: 19 mg/dL (ref 6–23)
CHLORIDE: 105 meq/L (ref 96–112)
CO2: 29 mEq/L (ref 19–32)
Calcium: 9.8 mg/dL (ref 8.4–10.5)
Creatinine, Ser: 0.87 mg/dL (ref 0.40–1.20)
GFR: 71.59 mL/min (ref >60.00)
Glucose, Bld: 96 mg/dL (ref 70–99)
POTASSIUM: 4.4 meq/L (ref 3.5–5.1)
Sodium: 141 mEq/L (ref 135–145)

## 2016-03-14 LAB — CBC WITH DIFFERENTIAL/PLATELET
BASOS ABS: 0 10*3/uL (ref 0.0–0.1)
BASOS PCT: 0.7 % (ref 0.0–3.0)
EOS ABS: 0.2 10*3/uL (ref 0.0–0.7)
Eosinophils Relative: 2.7 % (ref 0.0–5.0)
HCT: 41.7 % (ref 36.0–46.0)
Hemoglobin: 14.1 g/dL (ref 12.0–15.0)
LYMPHS ABS: 2.2 10*3/uL (ref 0.7–4.0)
Lymphocytes Relative: 35.2 % (ref 12.0–46.0)
MCHC: 33.9 g/dL (ref 30.0–36.0)
MCV: 88.9 fl (ref 78.0–100.0)
MONOS PCT: 7.9 % (ref 3.0–12.0)
Monocytes Absolute: 0.5 10*3/uL (ref 0.1–1.0)
NEUTROS ABS: 3.4 10*3/uL (ref 1.4–7.7)
NEUTROS PCT: 53.5 % (ref 43.0–77.0)
PLATELETS: 223 10*3/uL (ref 150.0–400.0)
RBC: 4.69 Mil/uL (ref 3.87–5.11)
RDW: 13 % (ref 11.5–15.5)
WBC: 6.3 10*3/uL (ref 4.0–10.5)

## 2016-03-14 LAB — TSH: TSH: 1.46 u[IU]/mL (ref 0.35–4.50)

## 2016-03-20 NOTE — Progress Notes (Signed)
Chief Complaint  Patient presents with  . Annual Exam    HPI: Patient  Deborah Lang  55 y.o. comes in today for [place] visit  Doing well . Struggles to avoid weight gain  Exercise   Health Maintenance  Topic Date Due  . HIV Screening  03/19/1975  . INFLUENZA VACCINE  07/15/2016  . MAMMOGRAM  10/15/2017  . PAP SMEAR  10/31/2018  . COLONOSCOPY  06/10/2021  . TETANUS/TDAP  10/22/2023  . Hepatitis C Screening  Completed   Health Maintenance Review LIFESTYLE:  Exercise:  gym 2 x per week yoga  Spin.    Tobacco/ETS: n Alcohol: 1 per week  Sugar beverages: very little  Sleep:  About  7 hours  ... Drug use: no womesn one a day     ROS:  GEN/ HEENT: No fever, significant weight changes sweats headaches vision problems hearing changes, CV/ PULM; No chest pain shortness of breath cough, syncope,edema  change in exercise tolerance. GI /GU: No adominal pain, vomiting, change in bowel habits. No blood in the stool. No significant GU symptoms. SKIN/HEME: ,no acute skin rashes suspicious lesions or bleeding. No lymphadenopathy, nodules, masses.  NEURO/ PSYCH:  No neurologic signs such as weakness numbness. No depression anxiety. IMM/ Allergy: No unusual infections.  Allergy .   REST of 12 system review negative except as per HPI   Past Medical History  Diagnosis Date  . Abnormal Pap smear   . GERD (gastroesophageal reflux disease)   . Chicken pox   . HX: benign breast biopsy 2002    Past Surgical History  Procedure Laterality Date  . Cervical biopsy  w/ loop electrode excision  06/2001    CIN III  . Wisdom tooth extraction    . Lasik  2002  . Colposcopy  07-14-2001    CIN III  . Colposcopy  1996    dysplasia--colposcopy normal  . Breast surgery  2001    benign tumor removed left breast    Family History  Problem Relation Age of Onset  . Breast cancer Maternal Aunt   . Cancer Maternal Aunt     Breast Cancer  . Osteoarthritis Mother   . Stroke  Father 93  . Arthritis Father     Social History   Social History  . Marital Status: Single    Spouse Name: N/A  . Number of Children: N/A  . Years of Education: N/A   Social History Main Topics  . Smoking status: Never Smoker   . Smokeless tobacco: Never Used  . Alcohol Use: 0.6 oz/week    1 Standard drinks or equivalent per week     Comment: occ  . Drug Use: No  . Sexual Activity: No   Other Topics Concern  . 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   Masters level [place] from [employer] Prior to Visit  Medication Sig Dispense Refill  . fish oil-omega-3 fatty acids 1000 MG capsule Take 2 g by mouth daily.    . Multiple Vitamins-Minerals (MULTIVITAMIN PO) Take by mouth.     No facility-administered medications prior to visit.  EXAM:  BP 110/70 mmHg  Pulse 86  Temp(Src) 98.1 F (36.7 C) (Oral)  Ht 5' 8.5" (1.74 m)  Wt 177 lb (80.287 kg)  BMI 26.52 kg/m2  LMP 10/15/2014 (Approximate)  Body mass index is 26.52 kg/(m^2).  Physical Exam: Vital signs reviewed BJS:EGBT is a well-developed well-nourished alert cooperative    who appearsr stated age in no acute distress.  HEENT: normocephalic atraumatic , Eyes: PERRL EOM's full, conjunctiva clear, Nares: paten,t no deformity discharge or tenderness., Ears: no deformity EAC's clear TMs with normal landmarks. Mouth: clear OP, no lesions, edema.  Moist mucous membranes. Dentition in adequate repair. NECK: supple without masses, thyromegaly or bruits. CHEST/PULM:  Clear to auscultation and percussion breath sounds equal no wheeze , rales or rhonchi. No chest wall deformities or tenderness.Breast: normal by inspection . No dimpling, discharge, masses, tenderness or discharge . CV: PMI is nondisplaced, S1 S2  no gallops, murmurs, rubs. Peripheral pulses are full without delay.No JVD .  ABDOMEN: Bowel sounds normal nontender  No guard or rebound, no hepato splenomegal no CVA tenderness.  No hernia. Extremtities:  No clubbing cyanosis or edema, no acute joint swelling or redness no focal atrophy NEURO:  Oriented x3, cranial nerves 3-12 appear to be intact, no obvious focal weakness,gait within normal limits no abnormal reflexes or asymmetrical SKIN: No acute rashes normal turgor, color, no bruising or petechiae. sun freckling no alarm lesions PSYCH: Oriented, good eye contact, no obvious depression anxiety, cognition and judgment appear normal. LN: no cervical axillary inguinal adenopathy  Lab Results  Component Value Date   WBC 6.3 03/14/2016   HGB 14.1 03/14/2016   HCT 41.7 03/14/2016   PLT 223.0 03/14/2016   GLUCOSE 96 03/14/2016   CHOL 201* 03/14/2016   TRIG 89.0 03/14/2016   HDL 67.70 03/14/2016   LDLCALC 115* 03/14/2016   ALT 22 03/14/2016   AST 22 03/14/2016   NA 141 03/14/2016   K 4.4 03/14/2016   CL 105 03/14/2016   CREATININE 0.87 03/14/2016   BUN 19 03/14/2016   CO2 29 03/14/2016   TSH 1.46 03/14/2016    ASSESSMENT AND PLAN:  Discussed the following assessment and plan:  Visit for preventive health examination Counseled regarding healthy nutrition, exercise, sleep, injury prevention, calcium vit d and healthy weight .  Patient Care Team: Burnis Medin, MD as PCP - General (Internal Medicine) Nunzio Cobbs, MD as Consulting Physician (Obstetrics and Gynecology) Calvert Cantor, MD as Consulting Physician (Ophthalmology) Juanita Craver, MD as Consulting Physician (Gastroenterology) Patient Instructions   Continue lifestyle intervention healthy eating and exercise . Mediterranean diet is the most heart healthy.  cpx wellness visit every 1-2 years    (if seeing gyne and doing well  every2 years is ok for now.)  Health Maintenance, Female Adopting a healthy  lifestyle and getting preventive care can go a long way to promote health and wellness. Talk with your health care provider about what schedule of regular examinations is right for you. This is a good chance for you to check in with your provider about disease prevention and staying healthy. In between checkups, there are plenty of things you can do on your own. Experts have done a lot of research about which lifestyle changes and preventive measures are most likely to keep you healthy. Ask your health care provider for more information. WEIGHT AND DIET  Eat a healthy diet  Be sure to include plenty of vegetables, fruits, low-fat dairy products, and lean protein.  Do not eat a lot of foods high in solid fats, added sugars, or salt.  Get regular exercise. This is one of the most important things you can do for your health.  Most adults should exercise for at least 150 minutes each week. The exercise should increase your heart rate and make you sweat (moderate-intensity exercise).  Most adults should also do strengthening exercises at least twice a week. This is in addition to the moderate-intensity exercise.  Maintain a healthy weight  Body mass index (BMI) is a measurement that can be used to identify possible weight problems. It estimates body fat based on height and weight. Your health care provider can help determine your BMI and help you achieve or maintain a healthy weight.  For females 68 years of age and older:   A BMI below 18.5 is considered underweight.  A BMI of 18.5 to 24.9 is normal.  A BMI of 25 to 29.9 is considered overweight.  A BMI of 30 and above is considered obese.  Watch levels of cholesterol and blood lipids  You should start having your blood tested for lipids and cholesterol at 56 years of age, then have this test every 5 years.  You may need to have your cholesterol levels checked more often if:  Your lipid or cholesterol levels are high.  You are older  than 56 years of age.  You are at high risk for heart disease.  CANCER SCREENING   Lung Cancer  Lung cancer screening is recommended for adults 52-69 years old who are at high risk for lung cancer because of a history of smoking.  A yearly low-dose CT scan of the lungs is recommended for people who:  Currently smoke.  Have quit within the past 15 years.  Have at least a 30-pack-year history of smoking. A pack year is smoking an average of one pack of cigarettes a day for 1 year.  Yearly screening should continue until it has been 15 years since you quit.  Yearly screening should stop if you develop a health problem that would prevent you from having lung cancer treatment.  Breast Cancer  Practice breast self-awareness. This means understanding how your breasts normally appear and feel.  It also means doing regular breast self-exams. Let your health care provider know about any changes, no matter how small.  If you are in your 20s or 30s, you should have a clinical breast exam (CBE) by a health care provider every 1-3 years as part of a regular health exam.  If you are 52 or older, have a CBE every year. Also consider having a breast X-ray (mammogram) every year.  If you have a family history of breast cancer, talk to your health care provider about genetic screening.  If you are at high risk for breast cancer, talk to your health care provider about having an MRI and a mammogram every year.  Breast cancer gene (BRCA) assessment is recommended for women who have family members with BRCA-related cancers. BRCA-related cancers include:  Breast.  Ovarian.  Tubal.  Peritoneal cancers.  Results of the assessment will determine the need for genetic counseling and BRCA1 and BRCA2 testing. Cervical Cancer Your health care provider may recommend that you be screened regularly for cancer of the pelvic organs (ovaries, uterus, and vagina). This screening involves a pelvic  examination, including checking for microscopic changes to the surface of your cervix (Pap test). You may be encouraged to have this screening done every 3 years,  beginning at age 75.  For women ages 84-65, health care providers may recommend pelvic exams and Pap testing every 3 years, or they may recommend the Pap and pelvic exam, combined with testing for human papilloma virus (HPV), every 5 years. Some types of HPV increase your risk of cervical cancer. Testing for HPV may also be done on women of any age with unclear Pap test results.  Other health care providers may not recommend any screening for nonpregnant women who are considered low risk for pelvic cancer and who do not have symptoms. Ask your health care provider if a screening pelvic exam is right for you.  If you have had past treatment for cervical cancer or a condition that could lead to cancer, you need Pap tests and screening for cancer for at least 20 years after your treatment. If Pap tests have been discontinued, your risk factors (such as having a new sexual partner) need to be reassessed to determine if screening should resume. Some women have medical problems that increase the chance of getting cervical cancer. In these cases, your health care provider may recommend more frequent screening and Pap tests. Colorectal Cancer  This type of cancer can be detected and often prevented.  Routine colorectal cancer screening usually begins at 56 years of age and continues through 56 years of age.  Your health care provider may recommend screening at an earlier age if you have risk factors for colon cancer.  Your health care provider may also recommend using home test kits to check for hidden blood in the stool.  A small camera at the end of a tube can be used to examine your colon directly (sigmoidoscopy or colonoscopy). This is done to check for the earliest forms of colorectal cancer.  Routine screening usually begins at age  69.  Direct examination of the colon should be repeated every 5-10 years through 56 years of age. However, you may need to be screened more often if early forms of precancerous polyps or small growths are found. Skin Cancer  Check your skin from head to toe regularly.  Tell your health care provider about any new moles or changes in moles, especially if there is a change in a mole's shape or color.  Also tell your health care provider if you have a mole that is larger than the size of a pencil eraser.  Always use sunscreen. Apply sunscreen liberally and repeatedly throughout the day.  Protect yourself by wearing long sleeves, pants, a wide-brimmed hat, and sunglasses whenever you are outside. HEART DISEASE, DIABETES, AND HIGH BLOOD PRESSURE   High blood pressure causes heart disease and increases the risk of stroke. High blood pressure is more likely to develop in:  People who have blood pressure in the high end of the normal range (130-139/85-89 mm Hg).  People who are overweight or obese.  People who are African American.  If you are 43-61 years of age, have your blood pressure checked every 3-5 years. If you are 37 years of age or older, have your blood pressure checked every year. You should have your blood pressure measured twice--once when you are at a hospital or clinic, and once when you are not at a hospital or clinic. Record the average of the two measurements. To check your blood pressure when you are not at a hospital or clinic, you can use:  An automated blood pressure machine at a pharmacy.  A home blood pressure monitor.  If you are between  55 years and 67 years old, ask your health care provider if you should take aspirin to prevent strokes.  Have regular diabetes screenings. This involves taking a blood sample to check your fasting blood sugar level.  If you are at a normal weight and have a low risk for diabetes, have this test once every three years after 56 years  of age.  If you are overweight and have a high risk for diabetes, consider being tested at a younger age or more often. PREVENTING INFECTION  Hepatitis B  If you have a higher risk for hepatitis B, you should be screened for this virus. You are considered at high risk for hepatitis B if:  You were born in a country where hepatitis B is common. Ask your health care provider which countries are considered high risk.  Your parents were born in a high-risk country, and you have not been immunized against hepatitis B (hepatitis B vaccine).  You have HIV or AIDS.  You use needles to inject street drugs.  You live with someone who has hepatitis B.  You have had sex with someone who has hepatitis B.  You get hemodialysis treatment.  You take certain medicines for conditions, including cancer, organ transplantation, and autoimmune conditions. Hepatitis C  Blood testing is recommended for:  Everyone born from [birth year] through 1966.  Anyone with known risk factors for hepatitis C. Sexually transmitted infections (STIs)  You should be screened for sexually transmitted infections (STIs) including gonorrhea and chlamydia if:  You are sexually active and are younger than 56 years of age.  You are older than 56 years of age and your health care provider tells you that you are at risk for this type of infection.  Your sexual activity has changed since you were last screened and you are at an increased risk for chlamydia or gonorrhea. Ask your health care provider if you are at risk.  If you do not have HIV, but are at risk, it may be recommended that you take a prescription medicine daily to prevent HIV infection. This is called pre-exposure prophylaxis (PrEP). You are considered at risk if:  You are sexually active and do not regularly use condoms or know the HIV status of your partner(s).  You take drugs by injection.  You are sexually active with a partner who has HIV. Talk with your  health care provider about whether you are at high risk of being infected with HIV. If you choose to begin PrEP, you should first be tested for HIV. You should then be tested every 3 months for as long as you are taking PrEP.  PREGNANCY   If you are premenopausal and you may become pregnant, ask your health care provider about preconception counseling.  If you may become pregnant, take 400 to 800 micrograms (mcg) of folic acid every day.  If you want to prevent pregnancy, talk to your health care provider about birth control (contraception). OSTEOPOROSIS AND MENOPAUSE   Osteoporosis is a disease in which the bones lose minerals and strength with aging. This can result in serious bone fractures. Your risk for osteoporosis can be identified using a bone density scan.  If you are 16 years of age or older, or if you are at risk for osteoporosis and fractures, ask your health care provider if you should be screened.  Ask your health care provider whether you should take a calcium or vitamin D supplement to lower your risk for osteoporosis.  Menopause may  have certain physical symptoms and risks.  Hormone replacement therapy may reduce some of these symptoms and risks. Talk to your health care provider about whether hormone replacement therapy is right for you.  HOME CARE INSTRUCTIONS   Schedule regular health, dental, and eye exams.  Stay current with your immunizations.   Do not use any tobacco products including cigarettes, chewing tobacco, or electronic cigarettes.  If you are pregnant, do not drink alcohol.  If you are breastfeeding, limit how much and how often you drink alcohol.  Limit alcohol intake to no more than 1 drink per day for nonpregnant women. One drink equals 12 ounces of beer, 5 ounces of wine, or 1 ounces of hard liquor.  Do not use street drugs.  Do not share needles.  Ask your health care provider for help if you need support or information about quitting  drugs.  Tell your health care provider if you often feel depressed.  Tell your health care provider if you have ever been abused or do not feel safe at home.   This information is not intended to replace advice given to you by your health care provider. Make sure you discuss any questions you have with your health care provider.   Document Released: 06/16/2011 Document Revised: 12/22/2014 Document Reviewed: 11/02/2013 Elsevier Interactive Patient Education [employer].     Why follow it? Research shows. . Those who follow the Mediterranean diet have a reduced risk of heart disease  . The diet is associated with a reduced incidence of Parkinson's and Alzheimer's diseases . People following the diet may have longer life expectancies and lower rates of chronic diseases  . The Dietary Guidelines for Americans recommends the Mediterranean diet as an eating plan to promote health and prevent disease  What Is the Mediterranean Diet?  . Healthy eating plan based on typical foods and recipes of Mediterranean-style cooking . The diet is primarily a plant based diet; these foods should make up a majority of meals   Starches - Plant based foods should make up a majority of meals - They are an important sources of vitamins, minerals, energy, antioxidants, and fiber - Choose whole grains, foods high in fiber and minimally processed items  - Typical grain sources include wheat, oats, barley, corn, brown rice, bulgar, farro, millet, polenta, couscous  - Various types of beans include chickpeas, lentils, fava beans, black beans, white beans   Fruits  Veggies - Large quantities of antioxidant rich fruits & veggies; 6 or more servings  - Vegetables can be eaten raw or lightly drizzled with oil and cooked  - Vegetables common to the traditional Mediterranean Diet include: artichokes, arugula, beets, broccoli, brussel sprouts, cabbage, carrots, celery, collard greens, cucumbers, eggplant, kale, leeks,  lemons, lettuce, mushrooms, okra, onions, peas, peppers, potatoes, pumpkin, radishes, rutabaga, shallots, spinach, sweet potatoes, turnips, zucchini - Fruits common to the Mediterranean Diet include: apples, apricots, avocados, cherries, clementines, dates, figs, grapefruits, grapes, melons, nectarines, oranges, peaches, pears, pomegranates, strawberries, tangerines  Fats - Replace butter and margarine with healthy oils, such as olive oil, canola oil, and tahini  - Limit nuts to no more than a handful a day  - Nuts include walnuts, almonds, pecans, pistachios, pine nuts  - Limit or avoid candied, honey roasted or heavily salted nuts - Olives are central to the Mediterranean diet - can be eaten whole or used in a variety of dishes   Meats Protein - Limiting red meat: no more than a few times  a month - When eating red meat: choose lean cuts and keep the portion to the size of deck of cards - Eggs: approx. 0 to 4 times a week  - Fish and lean poultry: at least 2 a week  - Healthy protein sources include, chicken, [place], lean beef, lamb - Increase intake of seafood such as tuna, salmon, trout, mackerel, shrimp, scallops - Avoid or limit high fat processed meats such as sausage and bacon  Dairy - Include moderate amounts of low fat dairy products  - Focus on healthy dairy such as fat free yogurt, skim milk, low or reduced fat cheese - Limit dairy products higher in fat such as whole or 2% milk, cheese, ice cream  Alcohol - Moderate amounts of red wine is ok  - No more than 5 oz daily for women (all ages) and men older than age 56  - No more than 10 oz of wine daily for men younger than 5  Other - Limit sweets and other desserts  - Use herbs and spices instead of salt to flavor foods  - Herbs and spices common to the traditional Mediterranean Diet include: basil, bay leaves, chives, cloves, cumin, fennel, garlic, lavender, marjoram, mint, oregano, parsley, pepper, rosemary, sage, savory, sumac,  tarragon, thyme   It's not just a diet, it's a lifestyle:  . The Mediterranean diet includes lifestyle factors typical of those in the region  . Foods, drinks and meals are best eaten with others and savored . Daily physical activity is important for overall good health . This could be strenuous exercise like running and aerobics . This could also be more leisurely activities such as walking, housework, yard-work, or taking the stairs . Moderation is the key; a balanced and healthy diet accommodates most foods and drinks . Consider portion sizes and frequency of consumption of certain foods   Meal Ideas & Options:  . Breakfast:  o Whole wheat toast or whole wheat English muffins with peanut butter & hard boiled egg o Steel cut oats topped with apples & cinnamon and skim milk  o Fresh fruit: banana, strawberries, melon, berries, peaches  o Smoothies: strawberries, bananas, greek yogurt, peanut butter o Low fat greek yogurt with blueberries and granola  o Egg white omelet with spinach and mushrooms o Breakfast couscous: whole wheat couscous, apricots, skim milk, cranberries  . Sandwiches:  o Hummus and grilled vegetables (peppers, zucchini, squash) on whole wheat bread   o Grilled chicken on whole wheat pita with lettuce, tomatoes, cucumbers or tzatziki  o Tuna salad on whole wheat bread: tuna salad made with greek yogurt, olives, red peppers, capers, green onions o Garlic rosemary lamb pita: lamb sauted with garlic, rosemary, salt & pepper; add lettuce, cucumber, greek yogurt to pita - flavor with lemon juice and black pepper  . Seafood:  o Mediterranean grilled salmon, seasoned with garlic, basil, parsley, lemon juice and black pepper o Shrimp, lemon, and spinach whole-grain pasta salad made with low fat greek yogurt  o Seared scallops with lemon orzo  o Seared tuna steaks seasoned salt, pepper, coriander topped with tomato mixture of olives, tomatoes, olive oil, minced garlic, parsley,  green onions and cappers  . Meats:  o Herbed greek chicken salad with kalamata olives, cucumber, feta  o Red bell peppers stuffed with spinach, bulgur, lean ground beef (or lentils) & topped with feta   o Kebabs: skewers of chicken, tomatoes, onions, zucchini, squash  o [place] burgers: made with red onions, mint, dill,  lemon juice, feta cheese topped with roasted red peppers . Vegetarian o Cucumber salad: cucumbers, artichoke hearts, celery, red onion, feta cheese, tossed in olive oil & lemon juice  o Hummus and whole grain pita points with a greek salad (lettuce, tomato, feta, olives, cucumbers, red onion) o Lentil soup with celery, carrots made with vegetable broth, garlic, salt and pepper  o Tabouli salad: parsley, bulgur, mint, scallions, cucumbers, tomato, radishes, lemon juice, olive oil, salt and pepper.          Standley Brooking. Nohealani Medinger M.D.

## 2016-03-21 ENCOUNTER — Encounter: Payer: Self-pay | Admitting: Internal Medicine

## 2016-03-21 ENCOUNTER — Ambulatory Visit (INDEPENDENT_AMBULATORY_CARE_PROVIDER_SITE_OTHER): Payer: 59 | Admitting: Internal Medicine

## 2016-03-21 VITALS — BP 110/70 | HR 86 | Temp 98.1°F | Ht 68.5 in | Wt 177.0 lb

## 2016-03-21 DIAGNOSIS — Z0000 Encounter for general adult medical examination without abnormal findings: Secondary | ICD-10-CM

## 2016-03-21 NOTE — Progress Notes (Signed)
Pre visit review using our clinic review tool, if applicable. No additional management support is needed unless otherwise documented below in the visit note. 

## 2016-03-21 NOTE — Patient Instructions (Addendum)
Continue lifestyle intervention healthy eating and exercise . Mediterranean diet is the most heart healthy.  cpx wellness visit every 1-2 years    (if seeing gyne and doing well  every2 years is ok for now.)  Health Maintenance, Female Adopting a healthy lifestyle and getting preventive care can go a long way to promote health and wellness. Talk with your health care provider about what schedule of regular examinations is right for you. This is a good chance for you to check in with your provider about disease prevention and staying healthy. In between checkups, there are plenty of things you can do on your own. Experts have done a lot of research about which lifestyle changes and preventive measures are most likely to keep you healthy. Ask your health care provider for more information. WEIGHT AND DIET  Eat a healthy diet  Be sure to include plenty of vegetables, fruits, low-fat dairy products, and lean protein.  Do not eat a lot of foods high in solid fats, added sugars, or salt.  Get regular exercise. This is one of the most important things you can do for your health.  Most adults should exercise for at least 150 minutes each week. The exercise should increase your heart rate and make you sweat (moderate-intensity exercise).  Most adults should also do strengthening exercises at least twice a week. This is in addition to the moderate-intensity exercise.  Maintain a healthy weight  Body mass index (BMI) is a measurement that can be used to identify possible weight problems. It estimates body fat based on height and weight. Your health care provider can help determine your BMI and help you achieve or maintain a healthy weight.  For females 33 years of age and older:   A BMI below 18.5 is considered underweight.  A BMI of 18.5 to 24.9 is normal.  A BMI of 25 to 29.9 is considered overweight.  A BMI of 30 and above is considered obese.  Watch levels of cholesterol and blood  lipids  You should start having your blood tested for lipids and cholesterol at 56 years of age, then have this test every 5 years.  You may need to have your cholesterol levels checked more often if:  Your lipid or cholesterol levels are high.  You are older than 56 years of age.  You are at high risk for heart disease.  CANCER SCREENING   Lung Cancer  Lung cancer screening is recommended for adults 71-91 years old who are at high risk for lung cancer because of a history of smoking.  A yearly low-dose CT scan of the lungs is recommended for people who:  Currently smoke.  Have quit within the past 15 years.  Have at least a 30-pack-year history of smoking. A pack year is smoking an average of one pack of cigarettes a day for 1 year.  Yearly screening should continue until it has been 15 years since you quit.  Yearly screening should stop if you develop a health problem that would prevent you from having lung cancer treatment.  Breast Cancer  Practice breast self-awareness. This means understanding how your breasts normally appear and feel.  It also means doing regular breast self-exams. Let your health care provider know about any changes, no matter how small.  If you are in your 20s or 30s, you should have a clinical breast exam (CBE) by a health care provider every 1-3 years as part of a regular health exam.  If you are  28 or older, have a CBE every year. Also consider having a breast X-ray (mammogram) every year.  If you have a family history of breast cancer, talk to your health care provider about genetic screening.  If you are at high risk for breast cancer, talk to your health care provider about having an MRI and a mammogram every year.  Breast cancer gene (BRCA) assessment is recommended for women who have family members with BRCA-related cancers. BRCA-related cancers include:  Breast.  Ovarian.  Tubal.  Peritoneal cancers.  Results of the assessment  will determine the need for genetic counseling and BRCA1 and BRCA2 testing. Cervical Cancer Your health care provider may recommend that you be screened regularly for cancer of the pelvic organs (ovaries, uterus, and vagina). This screening involves a pelvic examination, including checking for microscopic changes to the surface of your cervix (Pap test). You may be encouraged to have this screening done every 3 years, beginning at age 61.  For women ages 27-65, health care providers may recommend pelvic exams and Pap testing every 3 years, or they may recommend the Pap and pelvic exam, combined with testing for human papilloma virus (HPV), every 5 years. Some types of HPV increase your risk of cervical cancer. Testing for HPV may also be done on women of any age with unclear Pap test results.  Other health care providers may not recommend any screening for nonpregnant women who are considered low risk for pelvic cancer and who do not have symptoms. Ask your health care provider if a screening pelvic exam is right for you.  If you have had past treatment for cervical cancer or a condition that could lead to cancer, you need Pap tests and screening for cancer for at least 20 years after your treatment. If Pap tests have been discontinued, your risk factors (such as having a new sexual partner) need to be reassessed to determine if screening should resume. Some women have medical problems that increase the chance of getting cervical cancer. In these cases, your health care provider may recommend more frequent screening and Pap tests. Colorectal Cancer  This type of cancer can be detected and often prevented.  Routine colorectal cancer screening usually begins at 56 years of age and continues through 56 years of age.  Your health care provider may recommend screening at an earlier age if you have risk factors for colon cancer.  Your health care provider may also recommend using home test kits to check  for hidden blood in the stool.  A small camera at the end of a tube can be used to examine your colon directly (sigmoidoscopy or colonoscopy). This is done to check for the earliest forms of colorectal cancer.  Routine screening usually begins at age 57.  Direct examination of the colon should be repeated every 5-10 years through 56 years of age. However, you may need to be screened more often if early forms of precancerous polyps or small growths are found. Skin Cancer  Check your skin from head to toe regularly.  Tell your health care provider about any new moles or changes in moles, especially if there is a change in a mole's shape or color.  Also tell your health care provider if you have a mole that is larger than the size of a pencil eraser.  Always use sunscreen. Apply sunscreen liberally and repeatedly throughout the day.  Protect yourself by wearing long sleeves, pants, a wide-brimmed hat, and sunglasses whenever you are outside. HEART  DISEASE, DIABETES, AND HIGH BLOOD PRESSURE   High blood pressure causes heart disease and increases the risk of stroke. High blood pressure is more likely to develop in:  People who have blood pressure in the high end of the normal range (130-139/85-89 mm Hg).  People who are overweight or obese.  People who are African American.  If you are 44-66 years of age, have your blood pressure checked every 3-5 years. If you are 51 years of age or older, have your blood pressure checked every year. You should have your blood pressure measured twice--once when you are at a hospital or clinic, and once when you are not at a hospital or clinic. Record the average of the two measurements. To check your blood pressure when you are not at a hospital or clinic, you can use:  An automated blood pressure machine at a pharmacy.  A home blood pressure monitor.  If you are between 92 years and 27 years old, ask your health care provider if you should take  aspirin to prevent strokes.  Have regular diabetes screenings. This involves taking a blood sample to check your fasting blood sugar level.  If you are at a normal weight and have a low risk for diabetes, have this test once every three years after 56 years of age.  If you are overweight and have a high risk for diabetes, consider being tested at a younger age or more often. PREVENTING INFECTION  Hepatitis B  If you have a higher risk for hepatitis B, you should be screened for this virus. You are considered at high risk for hepatitis B if:  You were born in a country where hepatitis B is common. Ask your health care provider which countries are considered high risk.  Your parents were born in a high-risk country, and you have not been immunized against hepatitis B (hepatitis B vaccine).  You have HIV or AIDS.  You use needles to inject street drugs.  You live with someone who has hepatitis B.  You have had sex with someone who has hepatitis B.  You get hemodialysis treatment.  You take certain medicines for conditions, including cancer, organ transplantation, and autoimmune conditions. Hepatitis C  Blood testing is recommended for:  Everyone born from [birth year] through 1966.  Anyone with known risk factors for hepatitis C. Sexually transmitted infections (STIs)  You should be screened for sexually transmitted infections (STIs) including gonorrhea and chlamydia if:  You are sexually active and are younger than 56 years of age.  You are older than 56 years of age and your health care provider tells you that you are at risk for this type of infection.  Your sexual activity has changed since you were last screened and you are at an increased risk for chlamydia or gonorrhea. Ask your health care provider if you are at risk.  If you do not have HIV, but are at risk, it may be recommended that you take a prescription medicine daily to prevent HIV infection. This is called  pre-exposure prophylaxis (PrEP). You are considered at risk if:  You are sexually active and do not regularly use condoms or know the HIV status of your partner(s).  You take drugs by injection.  You are sexually active with a partner who has HIV. Talk with your health care provider about whether you are at high risk of being infected with HIV. If you choose to begin PrEP, you should first be tested for HIV. You should  then be tested every 3 months for as long as you are taking PrEP.  PREGNANCY   If you are premenopausal and you may become pregnant, ask your health care provider about preconception counseling.  If you may become pregnant, take 400 to 800 micrograms (mcg) of folic acid every day.  If you want to prevent pregnancy, talk to your health care provider about birth control (contraception). OSTEOPOROSIS AND MENOPAUSE   Osteoporosis is a disease in which the bones lose minerals and strength with aging. This can result in serious bone fractures. Your risk for osteoporosis can be identified using a bone density scan.  If you are 90 years of age or older, or if you are at risk for osteoporosis and fractures, ask your health care provider if you should be screened.  Ask your health care provider whether you should take a calcium or vitamin D supplement to lower your risk for osteoporosis.  Menopause may have certain physical symptoms and risks.  Hormone replacement therapy may reduce some of these symptoms and risks. Talk to your health care provider about whether hormone replacement therapy is right for you.  HOME CARE INSTRUCTIONS   Schedule regular health, dental, and eye exams.  Stay current with your immunizations.   Do not use any tobacco products including cigarettes, chewing tobacco, or electronic cigarettes.  If you are pregnant, do not drink alcohol.  If you are breastfeeding, limit how much and how often you drink alcohol.  Limit alcohol intake to no more than 1  drink per day for nonpregnant women. One drink equals 12 ounces of beer, 5 ounces of wine, or 1 ounces of hard liquor.  Do not use street drugs.  Do not share needles.  Ask your health care provider for help if you need support or information about quitting drugs.  Tell your health care provider if you often feel depressed.  Tell your health care provider if you have ever been abused or do not feel safe at home.   This information is not intended to replace advice given to you by your health care provider. Make sure you discuss any questions you have with your health care provider.   Document Released: 06/16/2011 Document Revised: 12/22/2014 Document Reviewed: 11/02/2013 Elsevier Interactive Patient Education [employer].     Why follow it? Research shows. . Those who follow the Mediterranean diet have a reduced risk of heart disease  . The diet is associated with a reduced incidence of Parkinson's and Alzheimer's diseases . People following the diet may have longer life expectancies and lower rates of chronic diseases  . The Dietary Guidelines for Americans recommends the Mediterranean diet as an eating plan to promote health and prevent disease  What Is the Mediterranean Diet?  . Healthy eating plan based on typical foods and recipes of Mediterranean-style cooking . The diet is primarily a plant based diet; these foods should make up a majority of meals   Starches - Plant based foods should make up a majority of meals - They are an important sources of vitamins, minerals, energy, antioxidants, and fiber - Choose whole grains, foods high in fiber and minimally processed items  - Typical grain sources include wheat, oats, barley, corn, brown rice, bulgar, farro, millet, polenta, couscous  - Various types of beans include chickpeas, lentils, fava beans, black beans, white beans   Fruits  Veggies - Large quantities of antioxidant rich fruits & veggies; 6 or more servings  -  Vegetables can be  eaten raw or lightly drizzled with oil and cooked  - Vegetables common to the traditional Mediterranean Diet include: artichokes, arugula, beets, broccoli, brussel sprouts, cabbage, carrots, celery, collard greens, cucumbers, eggplant, kale, leeks, lemons, lettuce, mushrooms, okra, onions, peas, peppers, potatoes, pumpkin, radishes, rutabaga, shallots, spinach, sweet potatoes, turnips, zucchini - Fruits common to the Mediterranean Diet include: apples, apricots, avocados, cherries, clementines, dates, figs, grapefruits, grapes, melons, nectarines, oranges, peaches, pears, pomegranates, strawberries, tangerines  Fats - Replace butter and margarine with healthy oils, such as olive oil, canola oil, and tahini  - Limit nuts to no more than a handful a day  - Nuts include walnuts, almonds, pecans, pistachios, pine nuts  - Limit or avoid candied, honey roasted or heavily salted nuts - Olives are central to the [employer] - can be eaten whole or used in a variety of dishes   Meats Protein - Limiting red meat: no more than a few times a month - When eating red meat: choose lean cuts and keep the portion to the size of deck of cards - Eggs: approx. 0 to 4 times a week  - Fish and lean poultry: at least 2 a week  - Healthy protein sources include, chicken, [place], lean beef, lamb - Increase intake of seafood such as tuna, salmon, trout, mackerel, shrimp, scallops - Avoid or limit high fat processed meats such as sausage and bacon  Dairy - Include moderate amounts of low fat dairy products  - Focus on healthy dairy such as fat free yogurt, skim milk, low or reduced fat cheese - Limit dairy products higher in fat such as whole or 2% milk, cheese, ice cream  Alcohol - Moderate amounts of red wine is ok  - No more than 5 oz daily for women (all ages) and men older than age 19  - No more than 10 oz of wine daily for men younger than 56  Other - Limit sweets and other desserts  - Use  herbs and spices instead of salt to flavor foods  - Herbs and spices common to the traditional Mediterranean Diet include: basil, bay leaves, chives, cloves, cumin, fennel, garlic, lavender, marjoram, mint, oregano, parsley, pepper, rosemary, sage, savory, sumac, tarragon, thyme   It's not just a diet, it's a lifestyle:  . The Mediterranean diet includes lifestyle factors typical of those in the region  . Foods, drinks and meals are best eaten with others and savored . Daily physical activity is important for overall good health . This could be strenuous exercise like running and aerobics . This could also be more leisurely activities such as walking, housework, yard-work, or taking the stairs . Moderation is the key; a balanced and healthy diet accommodates most foods and drinks . Consider portion sizes and frequency of consumption of certain foods   Meal Ideas & Options:  . Breakfast:  o Whole wheat toast or whole wheat English muffins with peanut butter & hard boiled egg o Steel cut oats topped with apples & cinnamon and skim milk  o Fresh fruit: banana, strawberries, melon, berries, peaches  o Smoothies: strawberries, bananas, greek yogurt, peanut butter o Low fat greek yogurt with blueberries and granola  o Egg white omelet with spinach and mushrooms o Breakfast couscous: whole wheat couscous, apricots, skim milk, cranberries  . Sandwiches:  o Hummus and grilled vegetables (peppers, zucchini, squash) on whole wheat bread   o Grilled chicken on whole wheat pita with lettuce, tomatoes, cucumbers or tzatziki  o [employer]  on whole wheat bread: tuna salad made with greek yogurt, olives, red peppers, capers, green onions o Garlic rosemary lamb pita: lamb sauted with garlic, rosemary, salt & pepper; add lettuce, cucumber, greek yogurt to pita - flavor with lemon juice and black pepper  . Seafood:  o Mediterranean grilled salmon, seasoned with garlic, basil, parsley, lemon juice and black  pepper o Shrimp, lemon, and spinach whole-grain pasta salad made with low fat greek yogurt  o Seared scallops with lemon orzo  o Seared tuna steaks seasoned salt, pepper, coriander topped with tomato mixture of olives, tomatoes, olive oil, minced garlic, parsley, green onions and cappers  . Meats:  o Herbed greek chicken salad with kalamata olives, cucumber, feta  o Red bell peppers stuffed with spinach, bulgur, lean ground beef (or lentils) & topped with feta   o Kebabs: skewers of chicken, tomatoes, onions, zucchini, squash  o [place] burgers: made with red onions, mint, dill, lemon juice, feta cheese topped with roasted red peppers . Vegetarian o Cucumber salad: cucumbers, artichoke hearts, celery, red onion, feta cheese, tossed in olive oil & lemon juice  o Hummus and whole grain pita points with a greek salad (lettuce, tomato, feta, olives, cucumbers, red onion) o Lentil soup with celery, carrots made with vegetable broth, garlic, salt and pepper  o Tabouli salad: parsley, bulgur, mint, scallions, cucumbers, tomato, radishes, lemon juice, olive oil, salt and pepper.

## 2016-09-19 ENCOUNTER — Other Ambulatory Visit: Payer: Self-pay | Admitting: Obstetrics and Gynecology

## 2016-09-19 DIAGNOSIS — Z1231 Encounter for screening mammogram for malignant neoplasm of breast: Secondary | ICD-10-CM

## 2016-10-20 ENCOUNTER — Ambulatory Visit: Payer: 59

## 2016-11-20 ENCOUNTER — Ambulatory Visit
Admission: RE | Admit: 2016-11-20 | Discharge: 2016-11-20 | Disposition: A | Discharge status: Home / Self Care | Payer: 59 | Source: Ambulatory Visit | Attending: Obstetrics and Gynecology | Admitting: Obstetrics and Gynecology

## 2016-11-20 DIAGNOSIS — Z1231 Encounter for screening mammogram for malignant neoplasm of breast: Secondary | ICD-10-CM

## 2016-11-20 NOTE — Progress Notes (Signed)
55 y.o. G0P0 Single Caucasian female here for annual exam.    Some difficulty sleeping.  Takes an ASA and this helps.   No real hot flashes.  No vaginal bleeding or spotting.   Leaks with sneeze and cough.  PCP:  Berniece AndreasWanda Panosh, MD   Patient's last menstrual period was 10/15/2014 (approximate).           Sexually active: No. female The current method of family planning is post menopausal status.    Exercising: Yes.    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 MMG:  11-20-16 - results pending. Colonoscopy:  2012 normal Dr. Thelma CompMann;next due 2022.  BMD:   n/a  Result  n/a TDaP:  10/2013 Gardasil:   N/A HIV:  Today. Hep C:  Today. Screening Labs:  Hb today: PCP, Urine today: PCP     reports that she has never smoked. She has never used smokeless tobacco. She reports that she drinks about 1.2 oz of alcohol per week . She reports that she does not use drugs.  Past Medical History:  Diagnosis Date  . Abnormal Pap smear   . Chicken pox   . GERD (gastroesophageal reflux disease)   . HX: benign breast biopsy 2002    Past Surgical History:  Procedure Laterality Date  . BREAST SURGERY  2001   benign tumor removed left breast  . CERVICAL BIOPSY  W/ LOOP ELECTRODE EXCISION  06/2001   CIN III  . COLPOSCOPY  07-14-2001   CIN III  . COLPOSCOPY  1996   dysplasia--colposcopy normal  . LASIK  2002  . WISDOM TOOTH EXTRACTION      Current Outpatient Prescriptions  Medication Sig Dispense Refill  . diphenhydrAMINE (BENADRYL) 50 MG tablet Take 50 mg by mouth at bedtime as needed for itching.    . fish oil-omega-3 fatty acids 1000 MG capsule Take 2 g by mouth daily.    . Multiple Vitamins-Minerals (MULTIVITAMIN PO) Take by mouth.    . naproxen sodium (ANAPROX) 220 MG tablet Take 220 mg by mouth as needed.     . ranitidine (ZANTAC) 150  MG tablet Take 150 mg by mouth 2 (two) times daily.     No current facility-administered medications for this visit.     Family History  Problem Relation Age of Onset  . Osteoarthritis Mother   . Stroke Father [id number]  . Arthritis Father   . Breast cancer Maternal Aunt   . Cancer Maternal Aunt     Breast Cancer    ROS:  Pertinent items are noted in HPI.  Otherwise, a comprehensive ROS was negative.  Exam:   BP 118/70 (BP Location: Right Arm, Patient Position: Sitting, Cuff Size: Normal)   Pulse 84   Resp 14   Ht 5' 7.75" (1.721 m)   Wt 181 lb (82.1 kg)   LMP 10/15/2014 (Approximate)   BMI 27.72 kg/m     General appearance: alert, cooperative and appears stated age Head: Normocephalic, without obvious abnormality, atraumatic Neck: no adenopathy, supple, symmetrical, trachea midline and thyroid normal to inspection and palpation Lungs: clear to auscultation bilaterally Breasts: normal appearance, no masses or tenderness, No nipple retraction or dimpling, No nipple discharge or bleeding, No axillary or supraclavicular adenopathy Heart: regular rate and rhythm Abdomen: soft, non-tender; no masses, no organomegaly Extremities: extremities normal, atraumatic, no  cyanosis or edema Skin: Skin color, texture, turgor normal. No rashes or lesions Lymph nodes: Cervical, supraclavicular, and axillary nodes normal. No abnormal inguinal nodes palpated Neurologic: Grossly normal  Pelvic: External genitalia:  no lesions              Urethra:  normal appearing urethra with no masses, tenderness or lesions              Bartholins and Skenes: normal                 Vagina: normal appearing vagina with normal color and discharge, no lesions              Cervix: no lesions              Pap taken: No. Bimanual Exam:  Uterus:  normal size, contour, position, consistency, mobility, non-tender              Adnexa: no mass, fullness, tenderness              Rectal exam: Yes.  .  Confirms.               Anus:  normal sphincter tone, no lesions  Chaperone was present for exam.  Assessment:   Well woman visit with normal exam. Hx of recurrent dysplasia.  Status post LEEP for CIN III. Menopausal female.  No symptoms.  Mild GSI. Occasional insomnia.  Infectious disease screening.   Plan: Yearly mammogram recommended after age 56.  Recommended self breast exam.  Pap and HR HPV next year.  Discussed Calcium, Vitamin D, regular exercise program including cardiovascular and weight bearing exercise. HIV and Hep C testing.  Do Kegel's.  May try Impressa. Melatonin for insomnia. Follow up annually and prn.       After visit summary provided.

## 2016-11-21 ENCOUNTER — Ambulatory Visit (INDEPENDENT_AMBULATORY_CARE_PROVIDER_SITE_OTHER): Payer: 59 | Admitting: Obstetrics and Gynecology

## 2016-11-21 ENCOUNTER — Encounter: Payer: Self-pay | Admitting: Obstetrics and Gynecology

## 2016-11-21 VITALS — BP 118/70 | HR 84 | Resp 14 | Ht 67.75 in | Wt 181.0 lb

## 2016-11-21 DIAGNOSIS — Z01419 Encounter for gynecological examination (general) (routine) without abnormal findings: Secondary | ICD-10-CM | POA: Diagnosis not present

## 2016-11-21 DIAGNOSIS — Z119 Encounter for screening for infectious and parasitic diseases, unspecified: Secondary | ICD-10-CM

## 2016-11-21 NOTE — Patient Instructions (Signed)

## 2016-11-22 LAB — HEPATITIS C ANTIBODY: HCV AB: NEGATIVE

## 2016-11-22 LAB — HIV ANTIBODY (ROUTINE TESTING W REFLEX): HIV 1&2 Ab, 4th Generation: NONREACTIVE

## 2017-03-12 DIAGNOSIS — H5213 Myopia, bilateral: Secondary | ICD-10-CM | POA: Diagnosis not present

## 2017-03-12 DIAGNOSIS — H04123 Dry eye syndrome of bilateral lacrimal glands: Secondary | ICD-10-CM | POA: Diagnosis not present

## 2017-03-12 DIAGNOSIS — H25813 Combined forms of age-related cataract, bilateral: Secondary | ICD-10-CM | POA: Diagnosis not present

## 2017-03-12 DIAGNOSIS — H524 Presbyopia: Secondary | ICD-10-CM | POA: Diagnosis not present

## 2017-03-12 DIAGNOSIS — H52223 Regular astigmatism, bilateral: Secondary | ICD-10-CM | POA: Diagnosis not present

## 2017-04-14 HISTORY — PX: WISDOM TOOTH EXTRACTION: SHX21

## 2017-09-04 ENCOUNTER — Encounter: Payer: Self-pay | Admitting: Internal Medicine

## 2017-09-18 ENCOUNTER — Other Ambulatory Visit: Payer: Self-pay | Admitting: Obstetrics and Gynecology

## 2017-09-18 DIAGNOSIS — Z1231 Encounter for screening mammogram for malignant neoplasm of breast: Secondary | ICD-10-CM

## 2017-11-23 ENCOUNTER — Ambulatory Visit: Payer: 59

## 2017-11-25 ENCOUNTER — Encounter: Payer: Self-pay | Admitting: Obstetrics and Gynecology

## 2017-11-25 ENCOUNTER — Other Ambulatory Visit: Payer: Self-pay

## 2017-11-25 ENCOUNTER — Ambulatory Visit (INDEPENDENT_AMBULATORY_CARE_PROVIDER_SITE_OTHER): Payer: 59 | Admitting: Obstetrics and Gynecology

## 2017-11-25 VITALS — BP 118/62 | HR 72 | Resp 16 | Ht 67.25 in | Wt 177.0 lb

## 2017-11-25 DIAGNOSIS — Z01419 Encounter for gynecological examination (general) (routine) without abnormal findings: Secondary | ICD-10-CM

## 2017-11-25 NOTE — Patient Instructions (Signed)
EXERCISE AND DIET:  We recommended that you start or continue a regular exercise program for good health. Regular exercise means any activity that makes your heart beat faster and makes you sweat.  We recommend exercising at least 30 minutes per day at least 3 days a week, preferably 4 or 5.  We also recommend a diet low in fat and sugar.  Inactivity, poor dietary choices and obesity can cause diabetes, heart attack, stroke, and kidney damage, among others.    ALCOHOL AND SMOKING:  Women should limit their alcohol intake to no more than 7 drinks/beers/glasses of wine (combined, not each!) per week. Moderation of alcohol intake to this level decreases your risk of breast cancer and liver damage. And of course, no recreational drugs are part of a healthy lifestyle.  And absolutely no smoking or even second hand smoke. Most people know smoking can cause heart and lung diseases, but did you know it also contributes to weakening of your bones? Aging of your skin?  Yellowing of your teeth and nails?  CALCIUM AND VITAMIN D:  Adequate intake of calcium and Vitamin D are recommended.  The recommendations for exact amounts of these supplements seem to change often, but generally speaking 600 mg of calcium (either carbonate or citrate) and 800 units of Vitamin D per day seems prudent. Certain women may benefit from higher intake of Vitamin D.  If you are among these women, your doctor will have told you during your visit.    PAP SMEARS:  Pap smears, to check for cervical cancer or precancers,  have traditionally been done yearly, although recent scientific advances have shown that most women can have pap smears less often.  However, every woman still should have a physical exam from her gynecologist every year. It will include a breast check, inspection of the vulva and vagina to check for abnormal growths or skin changes, a visual exam of the cervix, and then an exam to evaluate the size and shape of the uterus and  ovaries.  And after 57 years of age, a rectal exam is indicated to check for rectal cancers. We will also provide age appropriate advice regarding health maintenance, like when you should have certain vaccines, screening for sexually transmitted diseases, bone density testing, colonoscopy, mammograms, etc.   MAMMOGRAMS:  All women over 40 years old should have a yearly mammogram. Many facilities now offer a "3D" mammogram, which may cost around $50 extra out of pocket. If possible,  we recommend you accept the option to have the 3D mammogram performed.  It both reduces the number of women who will be called back for extra views which then turn out to be normal, and it is better than the routine mammogram at detecting truly abnormal areas.    COLONOSCOPY:  Colonoscopy to screen for colon cancer is recommended for all women at age 50.  We know, you hate the idea of the prep.  We agree, BUT, having colon cancer and not knowing it is worse!!  Colon cancer so often starts as a polyp that can be seen and removed at colonscopy, which can quite literally save your life!  And if your first colonoscopy is normal and you have no family history of colon cancer, most women don't have to have it again for 10 years.  Once every ten years, you can do something that may end up saving your life, right?  We will be happy to help you get it scheduled when you are ready.    Be sure to check your insurance coverage so you understand how much it will cost.  It may be covered as a preventative service at no cost, but you should check your particular policy.      Breast Self-Awareness Breast self-awareness means being familiar with how your breasts look and feel. It involves checking your breasts regularly and reporting any changes to your health care provider. Practicing breast self-awareness is important. A change in your breasts can be a sign of a serious medical problem. Being familiar with how your breasts look and feel allows  you to find any problems early, when treatment is more likely to be successful. All women should practice breast self-awareness, including women who have had breast implants. How to do a breast self-exam One way to learn what is normal for your breasts and whether your breasts are changing is to do a breast self-exam. To do a breast self-exam: Look for Changes  1. Remove all the clothing above your waist. 2. Stand in front of a mirror in a room with good lighting. 3. Put your hands on your hips. 4. Push your hands firmly downward. 5. Compare your breasts in the mirror. Look for differences between them (asymmetry), such as: ? Differences in shape. ? Differences in size. ? Puckers, dips, and bumps in one breast and not the other. 6. Look at each breast for changes in your skin, such as: ? Redness. ? Scaly areas. 7. Look for changes in your nipples, such as: ? Discharge. ? Bleeding. ? Dimpling. ? Redness. ? A change in position. Feel for Changes  Carefully feel your breasts for lumps and changes. It is best to do this while lying on your back on the floor and again while sitting or standing in the shower or tub with soapy water on your skin. Feel each breast in the following way:  Place the arm on the side of the breast you are examining above your head.  Feel your breast with the other hand.  Start in the nipple area and make  inch (2 cm) overlapping circles to feel your breast. Use the pads of your three middle fingers to do this. Apply light pressure, then medium pressure, then firm pressure. The light pressure will allow you to feel the tissue closest to the skin. The medium pressure will allow you to feel the tissue that is a little deeper. The firm pressure will allow you to feel the tissue close to the ribs.  Continue the overlapping circles, moving downward over the breast until you feel your ribs below your breast.  Move one finger-width toward the center of the body.  Continue to use the  inch (2 cm) overlapping circles to feel your breast as you move slowly up toward your collarbone.  Continue the up and down exam using all three pressures until you reach your armpit.  Write Down What You Find  Write down what is normal for each breast and any changes that you find. Keep a written record with breast changes or normal findings for each breast. By writing this information down, you do not need to depend only on memory for size, tenderness, or location. Write down where you are in your menstrual cycle, if you are still menstruating. If you are having trouble noticing differences in your breasts, do not get discouraged. With time you will become more familiar with the variations in your breasts and more comfortable with the exam. How often should I examine my breasts? Examine   your breasts every month. If you are breastfeeding, the best time to examine your breasts is after a feeding or after using a breast pump. If you menstruate, the best time to examine your breasts is 5-7 days after your period is over. During your period, your breasts are lumpier, and it may be more difficult to notice changes. When should I see my health care provider? See your health care provider if you notice:  A change in shape or size of your breasts or nipples.  A change in the skin of your breast or nipples, such as a reddened or scaly area.  Unusual discharge from your nipples.  A lump or thick area that was not there before.  Pain in your breasts.  Anything that concerns you.  This information is not intended to replace advice given to you by your health care provider. Make sure you discuss any questions you have with your health care provider. Document Released: 12/01/2005 Document Revised: 05/08/2016 Document Reviewed: 10/21/2015 Elsevier Interactive Patient Education  2018 [employer]  Kegel Exercises Kegel exercises help strengthen the muscles that support the  rectum, vagina, small intestine, bladder, and uterus. Doing Kegel exercises can help:  Improve bladder and bowel control.  Improve sexual response.  Reduce problems and discomfort during pregnancy.  Kegel exercises involve squeezing your pelvic floor muscles, which are the same muscles you squeeze when you try to stop the flow of urine. The exercises can be done while sitting, standing, or lying down, but it is best to vary your position. Phase 1 exercises 1. Squeeze your pelvic floor muscles tight. You should feel a tight lift in your rectal area. If you are a female, you should also feel a tightness in your vaginal area. Keep your stomach, buttocks, and legs relaxed. 2. Hold the muscles tight for up to 10 seconds. 3. Relax your muscles. Repeat this exercise 50 times a day or as many times as told by your health care provider. Continue to do this exercise for at least 4-6 weeks or for as long as told by your health care provider. This information is not intended to replace advice given to you by your health care provider. Make sure you discuss any questions you have with your health care provider. Document Released: 11/17/2012 Document Revised: 07/26/2016 Document Reviewed: 10/21/2015 Elsevier Interactive Patient Education  2018 [employer]  

## 2017-11-25 NOTE — Progress Notes (Signed)
56 y.o. G0P0 SingleCaucasianF here for annual exam.  No vaginal bleeding. No vasomotor symptoms. Not currently sexually active.  She has issues with reflux, will f/u with primary. She has rare urinary leakage with cough.     Patient's last menstrual period was 10/18/2014.          Sexually active: No.  The current method of family planning is post menopausal status.    Exercising: Yes.    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 she has never used smokeless tobacco. She reports that she drinks about 1.2 oz of alcohol per week. She reports that she does not use drugs. She works at [employer], [employer] work.   Past Medical History:  Diagnosis Date  . Abnormal Pap smear   . Chicken pox   . GERD (gastroesophageal reflux disease)   . HX: benign breast biopsy 2002    Past Surgical History:  Procedure Laterality Date  . BREAST SURGERY  2001   benign tumor removed left breast  . CERVICAL BIOPSY  W/ LOOP ELECTRODE EXCISION  06/2001   CIN III  . COLPOSCOPY  07-14-2001   CIN III  . COLPOSCOPY  1996   dysplasia--colposcopy normal  . LASIK  2002  . WISDOM TOOTH EXTRACTION      Current Outpatient Medications  Medication Sig Dispense Refill  . diphenhydrAMINE (BENADRYL) 50 MG tablet Take 50 mg by mouth at bedtime as needed for itching.    . fish oil-omega-3 fatty acids 1000 MG capsule Take 2 g by mouth daily.    . Multiple Vitamins-Minerals (MULTIVITAMIN PO) Take by mouth.    . naproxen sodium (ANAPROX) 220 MG tablet Take 220 mg by mouth as needed.     . ranitidine (ZANTAC) 150 MG tablet Take 150 mg by mouth 2 (two) times daily.     No current  facility-administered medications for this visit.   Takes melatonin prn  Family History  Problem Relation Age of Onset  . Osteoarthritis Mother   . Stroke Father [id number]  . Arthritis Father   . Breast cancer Maternal Aunt        late 60's    Review of Systems  Constitutional: Negative.   HENT: Negative.   Eyes: Negative.   Respiratory: Negative.   Cardiovascular: Negative.   Gastrointestinal: Negative.   Endocrine: Negative.   Genitourinary: Negative.   Musculoskeletal: Negative.   Skin: Negative.   Allergic/Immunologic: Negative.   Neurological: Negative.   Psychiatric/Behavioral: Negative.     Exam:   BP 118/62 (BP Location: Right Arm, Patient Position: Sitting, Cuff Size: Normal)   Pulse 72   Resp 16   Ht 5' 7.25" (1.708 m)   Wt 177 lb (80.3 kg)   LMP 10/18/2014   BMI 27.52 kg/m   Weight change: @WEIGHTCHANGE @ Height:   Height: 5' 7.25" (170.8 cm)  Ht Readings from Last 3 Encounters:  11/25/17 5' 7.25" (1.708 m)  11/21/16 5' 7.75" (1.721 m)  03/21/16 5' 8.5" (1.74 m)    General appearance: alert, cooperative and appears stated age Head: Normocephalic, without obvious abnormality, atraumatic Neck: no adenopathy,  supple, symmetrical, trachea midline and thyroid normal to inspection and palpation Lungs: clear to auscultation bilaterally Cardiovascular: regular rate and rhythm Breasts: normal appearance, no masses or tenderness Abdomen: soft, non-tender; non distended,  no masses,  no organomegaly Extremities: extremities normal, atraumatic, no cyanosis or edema Skin: Skin color, texture, turgor normal. No rashes or lesions Lymph nodes: Cervical, supraclavicular, and axillary nodes normal. No abnormal inguinal nodes palpated Neurologic: Grossly normal   Pelvic: External genitalia:  no lesions              Urethra:  normal appearing urethra with no masses, tenderness or lesions              Bartholins and Skenes: normal                 Vagina: normal appearing  atrophic vagina with normal color and discharge, no lesions              Cervix: no lesions               Bimanual Exam:  Uterus:  normal size, contour, position, consistency, mobility, non-tender              Adnexa: no mass, fullness, tenderness               Rectovaginal: Confirms               Anus:  normal sphincter tone, no lesions  Chaperone was present for exam.  A:  Well Woman with normal exam   Rare GSI, kegel information given  P:   Pap next year  Labs with primary  Mammogram scheduled  Colonoscopy is UTD  Discussed breast self exam  Discussed calcium and vit D intake

## 2017-11-27 ENCOUNTER — Ambulatory Visit: Payer: 59 | Admitting: Obstetrics and Gynecology

## 2017-12-18 ENCOUNTER — Ambulatory Visit
Admission: RE | Admit: 2017-12-18 | Discharge: 2017-12-18 | Disposition: A | Discharge status: Home / Self Care | Payer: 59 | Source: Ambulatory Visit | Attending: Obstetrics and Gynecology | Admitting: Obstetrics and Gynecology

## 2017-12-18 DIAGNOSIS — Z1231 Encounter for screening mammogram for malignant neoplasm of breast: Secondary | ICD-10-CM | POA: Diagnosis not present

## 2018-03-15 DIAGNOSIS — H25813 Combined forms of age-related cataract, bilateral: Secondary | ICD-10-CM | POA: Diagnosis not present

## 2018-03-15 DIAGNOSIS — H04123 Dry eye syndrome of bilateral lacrimal glands: Secondary | ICD-10-CM | POA: Diagnosis not present

## 2018-03-15 NOTE — Progress Notes (Signed)
Chief Complaint  Patient presents with  . Annual Exam    No new concerns    HPI: Patient  Deborah Lang  57 y.o. comes in today for Preventive Health Care visit   Health Maintenance  Topic Date Due  . INFLUENZA VACCINE  07/15/2018  . PAP SMEAR  10/31/2018  . MAMMOGRAM  12/19/2019  . COLONOSCOPY  06/10/2021  . TETANUS/TDAP  10/22/2023  . Hepatitis C Screening  Completed  . 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    ROS:  GEN/ HEENT: No fever, significant weight changes sweats headaches vision problems hearing changes, CV/ PULM; No chest pain shortness of breath cough, syncope,edema  change in exercise tolerance. GI /GU: No adominal pain, vomiting, change in bowel habits. No blood in the stool. No significant GU symptoms. SKIN/HEME: ,no acute skin rashes suspicious lesions or bleeding. No lymphadenopathy, nodules, masses.  Check skin on chest.  NEURO/ PSYCH:  No neurologic signs such as weakness numbness. No depression anxiety. IMM/ Allergy: No unusual infections.  Allergy .   REST of 12 system review negative except as per HPI   Past Medical History:  Diagnosis Date  . Abnormal Pap smear   . Chicken pox   . GERD (gastroesophageal reflux disease)   . HX: benign breast biopsy 2002    Past Surgical History:  Procedure Laterality Date  . BREAST SURGERY  2001   benign tumor removed left breast  . CERVICAL BIOPSY  W/ LOOP ELECTRODE EXCISION  06/2001   CIN III  . COLPOSCOPY  07-14-2001   CIN III  . COLPOSCOPY  1996   dysplasia--colposcopy normal  . LASIK  2002  . WISDOM TOOTH EXTRACTION    . WISDOM TOOTH EXTRACTION  04/2017    Family History  Problem Relation Age of Onset  . Osteoarthritis Mother   . Stroke Father 33  . Arthritis Father   . Breast cancer Maternal  Aunt        late 29's    Social History   Socioeconomic History  . Marital status: Single    Spouse name: Not on file  . Number of children: Not on file  . Years of education: Not on file  . Highest education level: Not on file  Occupational History  . Not on file  Social Needs  . Financial resource strain: Not on file  . Food insecurity:    Worry: Not on file    Inability: Not on file  . Transportation needs:    Medical: Not on file    Non-medical: Not on file  Tobacco Use  . Smoking status: Never Smoker  . Smokeless tobacco: Never Used  Substance and Sexual Activity  . Alcohol use: Yes    Alcohol/week: 1.2 oz    Types: 1 Glasses of wine, 1 Standard drinks or equivalent per week    Comment: occ  . Drug use: No  . Sexual activity: Never    Partners: Male    Birth control/protection: Post-menopausal  Lifestyle  . Physical activity:    Days per week: Not on file    Minutes per session: Not on file  . Stress: Not on file  Relationships  . Social connections:    Talks on phone: Not on file  Gets together: Not on file    Attends religious service: Not on file    Active member of club or organization: Not on file    Attends meetings of clubs or organizations: Not on file    Relationship status: Not on file  Other Topics Concern  . 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   Masters level [place] from [place]     Outpatient Medications Prior to Visit  Medication Sig Dispense Refill  . diphenhydrAMINE (BENADRYL) 50 MG tablet Take 50 mg by mouth at bedtime as needed for itching.    . fish oil-omega-3 fatty acids 1000 MG capsule Take 2 g by mouth daily.    . Multiple Vitamins-Minerals (MULTIVITAMIN PO) Take by mouth.    . naproxen sodium (ANAPROX) 220 MG tablet Take 220 mg  by mouth as needed.     . ranitidine (ZANTAC) 150 MG tablet Take 150 mg by mouth 2 (two) times daily.     No facility-administered medications prior to visit.      EXAM:  BP 102/64 (BP Location: Right Arm, Patient Position: Sitting, Cuff Size: Normal)   Pulse 76   Temp 97.6 F (36.4 C) (Oral)   Ht 5' 7.75" (1.721 m)   Wt 175 lb 3.2 oz (79.5 kg)   LMP 10/18/2014   BMI 26.84 kg/m   Body mass index is 26.84 kg/m. Wt Readings from Last 3 Encounters:  03/17/18 175 lb 3.2 oz (79.5 kg)  11/25/17 177 lb (80.3 kg)  11/21/16 181 lb (82.1 kg)    Physical Exam: Vital signs reviewed SWF:UXNA is a well-developed well-nourished alert cooperative    who appearsr stated age in no acute distress.  HEENT: normocephalic atraumatic , Eyes: PERRL EOM's full, conjunctiva clear, Nares: paten,t no deformity discharge or tenderness., Ears: no deformity EAC's clear TMs with normal landmarks. Mouth: clear OP, no lesions, edema.  Moist mucous membranes. Dentition in adequate repair. NECK: supple without masses, thyromegaly or bruits. CHEST/PULM:  Clear to auscultation and percussion breath sounds equal no wheeze , rales or rhonchi. No chest wall deformities or tenderness. Breast: normal by inspection . No dimpling, discharge, masses, tenderness or discharge . CV: PMI is nondisplaced, S1 S2 no gallops, murmurs, rubs. Peripheral pulses are full without delay.No JVD .  ABDOMEN: Bowel sounds normal nontender  No guard or rebound, no hepato splenomegal no CVA tenderness.  No hernia. Extremtities:  No clubbing cyanosis or edema, no acute joint swelling or redness no focal atrophy NEURO:  Oriented x3, cranial nerves 3-12 appear to be intact, no obvious focal weakness,gait within normal limits no abnormal reflexes or asymmetrical SKIN: No acute rashes normal turgor, color, no bruising or petechiae. Dry skin  Legs  scal area ant check no lesion 3 mm  PSYCH: Oriented, good eye contact, no obvious depression anxiety,  cognition and judgment appear normal. LN: no cervical axillary inguinal adenopathy    BP Readings from Last 3 Encounters:  03/17/18 102/64  11/25/17 118/62  11/21/16 118/70    ASSESSMENT AND PLAN:  Discussed the following assessment and plan:  Visit for preventive health examination - Plan: Basic metabolic panel, CBC with Differential/Platelet, Hepatic function panel, Lipid  panel, TSH  Elevated cholesterol - Plan: Basic metabolic panel, CBC with Differential/Platelet, Hepatic function panel, Lipid panel, TSH Follow skin  Areas if needed .  Counseled.  Patient Care Team: Panosh, Standley Brooking, MD as PCP - General (Internal Medicine) Nunzio Cobbs, MD as Consulting Physician (Obstetrics and Gynecology) Calvert Cantor, MD as Consulting Physician (Ophthalmology) Juanita Craver, MD as Consulting Physician (Gastroenterology) Patient Instructions  Will notify you  of labs when available.  Continue lifestyle intervention healthy eating and exercise .   Health Maintenance, Female Adopting a healthy lifestyle and getting preventive care can go a long way to promote health and wellness. Talk with your health care provider about what schedule of regular examinations is right for you. This is a good chance for you to check in with your provider about disease prevention and staying healthy. In between checkups, there are plenty of things you can do on your own. Experts have done a lot of research about which lifestyle changes and preventive measures are most likely to keep you healthy. Ask your health care provider for more information. Weight and diet Eat a healthy diet  Be sure to include plenty of vegetables, fruits, low-fat dairy products, and lean protein.  Do not eat a lot of foods high in solid fats, added sugars, or salt.  Get regular exercise. This is one of the most important things you can do for your health. ? Most adults should exercise for at least 150 minutes each week.  The exercise should increase your heart rate and make you sweat (moderate-intensity exercise). ? Most adults should also do strengthening exercises at least twice a week. This is in addition to the moderate-intensity exercise.  Maintain a healthy weight  Body mass index (BMI) is a measurement that can be used to identify possible weight problems. It estimates body fat based on height and weight. Your health care provider can help determine your BMI and help you achieve or maintain a healthy weight.  For females 54 years of age and older: ? A BMI below 18.5 is considered underweight. ? A BMI of 18.5 to 24.9 is normal. ? A BMI of 25 to 29.9 is considered overweight. ? A BMI of 30 and above is considered obese.  Watch levels of cholesterol and blood lipids  You should start having your blood tested for lipids and cholesterol at 57 years of age, then have this test every 5 years.  You may need to have your cholesterol levels checked more often if: ? Your lipid or cholesterol levels are high. ? You are older than 57 years of age. ? You are at high risk for heart disease.  Cancer screening Lung Cancer  Lung cancer screening is recommended for adults 72-30 years old who are at high risk for lung cancer because of a history of smoking.  A yearly low-dose CT scan of the lungs is recommended for people who: ? Currently smoke. ? Have quit within the past 15 years. ? Have at least a 30-pack-year history of smoking. A pack year is smoking an average of one pack of cigarettes a day for 1 year.  Yearly screening should continue until it has been 15 years since you quit.  Yearly screening should stop if you develop a health problem that would prevent you from having lung cancer treatment.  Breast Cancer  Practice breast self-awareness. This means understanding how your breasts normally appear and feel.  It also means doing regular breast self-exams. Let your  health care provider know about  any changes, no matter how small.  If you are in your 20s or 30s, you should have a clinical breast exam (CBE) by a health care provider every 1-3 years as part of a regular health exam.  If you are 91 or older, have a CBE every year. Also consider having a breast X-ray (mammogram) every year.  If you have a family history of breast cancer, talk to your health care provider about genetic screening.  If you are at high risk for breast cancer, talk to your health care provider about having an MRI and a mammogram every year.  Breast cancer gene (BRCA) assessment is recommended for women who have family members with BRCA-related cancers. BRCA-related cancers include: ? Breast. ? Ovarian. ? Tubal. ? Peritoneal cancers.  Results of the assessment will determine the need for genetic counseling and BRCA1 and BRCA2 testing.  Cervical Cancer Your health care provider may recommend that you be screened regularly for cancer of the pelvic organs (ovaries, uterus, and vagina). This screening involves a pelvic examination, including checking for microscopic changes to the surface of your cervix (Pap test). You may be encouraged to have this screening done every 3 years, beginning at age 31.  For women ages 11-65, health care providers may recommend pelvic exams and Pap testing every 3 years, or they may recommend the Pap and pelvic exam, combined with testing for human papilloma virus (HPV), every 5 years. Some types of HPV increase your risk of cervical cancer. Testing for HPV may also be done on women of any age with unclear Pap test results.  Other health care providers may not recommend any screening for nonpregnant women who are considered low risk for pelvic cancer and who do not have symptoms. Ask your health care provider if a screening pelvic exam is right for you.  If you have had past treatment for cervical cancer or a condition that could lead to cancer, you need Pap tests and screening for  cancer for at least 20 years after your treatment. If Pap tests have been discontinued, your risk factors (such as having a new sexual partner) need to be reassessed to determine if screening should resume. Some women have medical problems that increase the chance of getting cervical cancer. In these cases, your health care provider may recommend more frequent screening and Pap tests.  Colorectal Cancer  This type of cancer can be detected and often prevented.  Routine colorectal cancer screening usually begins at 58 years of age and continues through 58 years of age.  Your health care provider may recommend screening at an earlier age if you have risk factors for colon cancer.  Your health care provider may also recommend using home test kits to check for hidden blood in the stool.  A small camera at the end of a tube can be used to examine your colon directly (sigmoidoscopy or colonoscopy). This is done to check for the earliest forms of colorectal cancer.  Routine screening usually begins at age 44.  Direct examination of the colon should be repeated every 5-10 years through 58 years of age. However, you may need to be screened more often if early forms of precancerous polyps or small growths are found.  Skin Cancer  Check your skin from head to toe regularly.  Tell your health care provider about any new moles or changes in moles, especially if there is a change in a mole's shape or color.  Also  tell your health care provider if you have a mole that is larger than the size of a pencil eraser.  Always use sunscreen. Apply sunscreen liberally and repeatedly throughout the day.  Protect yourself by wearing long sleeves, pants, a wide-brimmed hat, and sunglasses whenever you are outside.  Heart disease, diabetes, and high blood pressure  High blood pressure causes heart disease and increases the risk of stroke. High blood pressure is more likely to develop in: ? People who have blood  pressure in the high end of the normal range (130-139/85-89 mm Hg). ? People who are overweight or obese. ? People who are African American.  If you are 81-40 years of age, have your blood pressure checked every 3-5 years. If you are 56 years of age or older, have your blood pressure checked every year. You should have your blood pressure measured twice-once when you are at a hospital or clinic, and once when you are not at a hospital or clinic. Record the average of the two measurements. To check your blood pressure when you are not at a hospital or clinic, you can use: ? An automated blood pressure machine at a pharmacy. ? A home blood pressure monitor.  If you are between 15 years and 10 years old, ask your health care provider if you should take aspirin to prevent strokes.  Have regular diabetes screenings. This involves taking a blood sample to check your fasting blood sugar level. ? If you are at a normal weight and have a low risk for diabetes, have this test once every three years after 58 years of age. ? If you are overweight and have a high risk for diabetes, consider being tested at a younger age or more often. Preventing infection Hepatitis B  If you have a higher risk for hepatitis B, you should be screened for this virus. You are considered at high risk for hepatitis B if: ? You were born in a country where hepatitis B is common. Ask your health care provider which countries are considered high risk. ? Your parents were born in a high-risk country, and you have not been immunized against hepatitis B (hepatitis B vaccine). ? You have HIV or AIDS. ? You use needles to inject street drugs. ? You live with someone who has hepatitis B. ? You have had sex with someone who has hepatitis B. ? You get hemodialysis treatment. ? You take certain medicines for conditions, including cancer, organ transplantation, and autoimmune conditions.  Hepatitis C  Blood testing is recommended  for: ? Everyone born from [birth year] through 1966. ? Anyone with known risk factors for hepatitis C.  Sexually transmitted infections (STIs)  You should be screened for sexually transmitted infections (STIs) including gonorrhea and chlamydia if: ? You are sexually active and are younger than 58 years of age. ? You are older than 58 years of age and your health care provider tells you that you are at risk for this type of infection. ? Your sexual activity has changed since you were last screened and you are at an increased risk for chlamydia or gonorrhea. Ask your health care provider if you are at risk.  If you do not have HIV, but are at risk, it may be recommended that you take a prescription medicine daily to prevent HIV infection. This is called pre-exposure prophylaxis (PrEP). You are considered at risk if: ? You are sexually active and do not regularly use condoms or know the HIV status of your  partner(s). ? You take drugs by injection. ? You are sexually active with a partner who has HIV.  Talk with your health care provider about whether you are at high risk of being infected with HIV. If you choose to begin PrEP, you should first be tested for HIV. You should then be tested every 3 months for as long as you are taking PrEP. Pregnancy  If you are premenopausal and you may become pregnant, ask your health care provider about preconception counseling.  If you may become pregnant, take 400 to 800 micrograms (mcg) of folic acid every day.  If you want to prevent pregnancy, talk to your health care provider about birth control (contraception). Osteoporosis and menopause  Osteoporosis is a disease in which the bones lose minerals and strength with aging. This can result in serious bone fractures. Your risk for osteoporosis can be identified using a bone density scan.  If you are 42 years of age or older, or if you are at risk for osteoporosis and fractures, ask your health care provider if  you should be screened.  Ask your health care provider whether you should take a calcium or vitamin D supplement to lower your risk for osteoporosis.  Menopause may have certain physical symptoms and risks.  Hormone replacement therapy may reduce some of these symptoms and risks. Talk to your health care provider about whether hormone replacement therapy is right for you. Follow these instructions at home:  Schedule regular health, dental, and eye exams.  Stay current with your immunizations.  Do not use any tobacco products including cigarettes, chewing tobacco, or electronic cigarettes.  If you are pregnant, do not drink alcohol.  If you are breastfeeding, limit how much and how often you drink alcohol.  Limit alcohol intake to no more than 1 drink per day for nonpregnant women. One drink equals 12 ounces of beer, 5 ounces of wine, or 1 ounces of hard liquor.  Do not use street drugs.  Do not share needles.  Ask your health care provider for help if you need support or information about quitting drugs.  Tell your health care provider if you often feel depressed.  Tell your health care provider if you have ever been abused or do not feel safe at home. This information is not intended to replace advice given to you by your health care provider. Make sure you discuss any questions you have with your health care provider. Document Released: 06/16/2011 Document Revised: 05/08/2016 Document Reviewed: 09/04/2015 Elsevier Interactive Patient Education  2018 [place]. Panosh M.D.

## 2018-03-17 ENCOUNTER — Ambulatory Visit (INDEPENDENT_AMBULATORY_CARE_PROVIDER_SITE_OTHER): Payer: 59 | Admitting: Internal Medicine

## 2018-03-17 ENCOUNTER — Encounter: Payer: Self-pay | Admitting: Internal Medicine

## 2018-03-17 VITALS — BP 102/64 | HR 76 | Temp 97.6°F | Ht 67.75 in | Wt 175.2 lb

## 2018-03-17 DIAGNOSIS — E7800 Pure hypercholesterolemia, unspecified: Secondary | ICD-10-CM

## 2018-03-17 DIAGNOSIS — Z0000 Encounter for general adult medical examination without abnormal findings: Secondary | ICD-10-CM | POA: Diagnosis not present

## 2018-03-17 LAB — HEPATIC FUNCTION PANEL
ALBUMIN: 4.1 g/dL (ref 3.5–5.2)
ALK PHOS: 77 U/L (ref 39–117)
ALT: 22 U/L (ref 0–35)
AST: 23 U/L (ref 0–37)
Bilirubin, Direct: 0.2 mg/dL (ref 0.0–0.3)
TOTAL PROTEIN: 7.1 g/dL (ref 6.0–8.3)
Total Bilirubin: 1 mg/dL (ref 0.2–1.2)

## 2018-03-17 LAB — BASIC METABOLIC PANEL
BUN: 20 mg/dL (ref 6–23)
CHLORIDE: 103 meq/L (ref 96–112)
CO2: 31 meq/L (ref 19–32)
Calcium: 9.6 mg/dL (ref 8.4–10.5)
Creatinine, Ser: 0.79 mg/dL (ref 0.40–1.20)
GFR: 79.45 mL/min (ref >60.00)
GLUCOSE: 88 mg/dL (ref 70–99)
POTASSIUM: 4.3 meq/L (ref 3.5–5.1)
Sodium: 140 mEq/L (ref 135–145)

## 2018-03-17 LAB — LIPID PANEL
CHOLESTEROL: 188 mg/dL (ref 0–200)
HDL: 69.8 mg/dL (ref >39.00)
LDL Cholesterol: 94 mg/dL (ref 0–99)
NONHDL: 118.46
Total CHOL/HDL Ratio: 3
Triglycerides: 122 mg/dL (ref 0.0–149.0)
VLDL: 24.4 mg/dL (ref 0.0–40.0)

## 2018-03-17 LAB — CBC WITH DIFFERENTIAL/PLATELET
BASOS ABS: 0 10*3/uL (ref 0.0–0.1)
Basophils Relative: 0.6 % (ref 0.0–3.0)
EOS PCT: 1.7 % (ref 0.0–5.0)
Eosinophils Absolute: 0.1 10*3/uL (ref 0.0–0.7)
HCT: 42.6 % (ref 36.0–46.0)
Hemoglobin: 14.2 g/dL (ref 12.0–15.0)
LYMPHS PCT: 28.3 % (ref 12.0–46.0)
Lymphs Abs: 1.6 10*3/uL (ref 0.7–4.0)
MCHC: 33.4 g/dL (ref 30.0–36.0)
MCV: 90.5 fl (ref 78.0–100.0)
MONOS PCT: 8.1 % (ref 3.0–12.0)
Monocytes Absolute: 0.5 10*3/uL (ref 0.1–1.0)
NEUTROS ABS: 3.5 10*3/uL (ref 1.4–7.7)
Neutrophils Relative %: 61.3 % (ref 43.0–77.0)
PLATELETS: 215 10*3/uL (ref 150.0–400.0)
RBC: 4.7 Mil/uL (ref 3.87–5.11)
RDW: 13.6 % (ref 11.5–15.5)
WBC: 5.6 10*3/uL (ref 4.0–10.5)

## 2018-03-17 LAB — TSH: TSH: 1.31 u[IU]/mL (ref 0.35–4.50)

## 2018-03-17 NOTE — Patient Instructions (Signed)
Will notify you  of labs when available.  Continue lifestyle intervention healthy eating and exercise .   Health Maintenance, Female Adopting a healthy lifestyle and getting preventive care can go a long way to promote health and wellness. Talk with your health care provider about what schedule of regular examinations is right for you. This is a good chance for you to check in with your provider about disease prevention and staying healthy. In between checkups, there are plenty of things you can do on your own. Experts have done a lot of research about which lifestyle changes and preventive measures are most likely to keep you healthy. Ask your health care provider for more information. Weight and diet Eat a healthy diet  Be sure to include plenty of vegetables, fruits, low-fat dairy products, and lean protein.  Do not eat a lot of foods high in solid fats, added sugars, or salt.  Get regular exercise. This is one of the most important things you can do for your health. ? Most adults should exercise for at least 150 minutes each week. The exercise should increase your heart rate and make you sweat (moderate-intensity exercise). ? Most adults should also do strengthening exercises at least twice a week. This is in addition to the moderate-intensity exercise.  Maintain a healthy weight  Body mass index (BMI) is a measurement that can be used to identify possible weight problems. It estimates body fat based on height and weight. Your health care provider can help determine your BMI and help you achieve or maintain a healthy weight.  For females 81 years of age and older: ? A BMI below 18.5 is considered underweight. ? A BMI of 18.5 to 24.9 is normal. ? A BMI of 25 to 29.9 is considered overweight. ? A BMI of 30 and above is considered obese.  Watch levels of cholesterol and blood lipids  You should start having your blood tested for lipids and cholesterol at 58 years of age, then have this  test every 5 years.  You may need to have your cholesterol levels checked more often if: ? Your lipid or cholesterol levels are high. ? You are older than 58 years of age. ? You are at high risk for heart disease.  Cancer screening Lung Cancer  Lung cancer screening is recommended for adults 5-4 years old who are at high risk for lung cancer because of a history of smoking.  A yearly low-dose CT scan of the lungs is recommended for people who: ? Currently smoke. ? Have quit within the past 15 years. ? Have at least a 30-pack-year history of smoking. A pack year is smoking an average of one pack of cigarettes a day for 1 year.  Yearly screening should continue until it has been 15 years since you quit.  Yearly screening should stop if you develop a health problem that would prevent you from having lung cancer treatment.  Breast Cancer  Practice breast self-awareness. This means understanding how your breasts normally appear and feel.  It also means doing regular breast self-exams. Let your health care provider know about any changes, no matter how small.  If you are in your 20s or 30s, you should have a clinical breast exam (CBE) by a health care provider every 1-3 years as part of a regular health exam.  If you are 11 or older, have a CBE every year. Also consider having a breast X-ray (mammogram) every year.  If you have a family history  of breast cancer, talk to your health care provider about genetic screening.  If you are at high risk for breast cancer, talk to your health care provider about having an MRI and a mammogram every year.  Breast cancer gene (BRCA) assessment is recommended for women who have family members with BRCA-related cancers. BRCA-related cancers include: ? Breast. ? Ovarian. ? Tubal. ? Peritoneal cancers.  Results of the assessment will determine the need for genetic counseling and BRCA1 and BRCA2 testing.  Cervical Cancer Your health care  provider may recommend that you be screened regularly for cancer of the pelvic organs (ovaries, uterus, and vagina). This screening involves a pelvic examination, including checking for microscopic changes to the surface of your cervix (Pap test). You may be encouraged to have this screening done every 3 years, beginning at age 20.  For women ages 58-65, health care providers may recommend pelvic exams and Pap testing every 3 years, or they may recommend the Pap and pelvic exam, combined with testing for human papilloma virus (HPV), every 5 years. Some types of HPV increase your risk of cervical cancer. Testing for HPV may also be done on women of any age with unclear Pap test results.  Other health care providers may not recommend any screening for nonpregnant women who are considered low risk for pelvic cancer and who do not have symptoms. Ask your health care provider if a screening pelvic exam is right for you.  If you have had past treatment for cervical cancer or a condition that could lead to cancer, you need Pap tests and screening for cancer for at least 20 years after your treatment. If Pap tests have been discontinued, your risk factors (such as having a new sexual partner) need to be reassessed to determine if screening should resume. Some women have medical problems that increase the chance of getting cervical cancer. In these cases, your health care provider may recommend more frequent screening and Pap tests.  Colorectal Cancer  This type of cancer can be detected and often prevented.  Routine colorectal cancer screening usually begins at 58 years of age and continues through 58 years of age.  Your health care provider may recommend screening at an earlier age if you have risk factors for colon cancer.  Your health care provider may also recommend using home test kits to check for hidden blood in the stool.  A small camera at the end of a tube can be used to examine your colon  directly (sigmoidoscopy or colonoscopy). This is done to check for the earliest forms of colorectal cancer.  Routine screening usually begins at age 27.  Direct examination of the colon should be repeated every 5-10 years through 58 years of age. However, you may need to be screened more often if early forms of precancerous polyps or small growths are found.  Skin Cancer  Check your skin from head to toe regularly.  Tell your health care provider about any new moles or changes in moles, especially if there is a change in a mole's shape or color.  Also tell your health care provider if you have a mole that is larger than the size of a pencil eraser.  Always use sunscreen. Apply sunscreen liberally and repeatedly throughout the day.  Protect yourself by wearing long sleeves, pants, a wide-brimmed hat, and sunglasses whenever you are outside.  Heart disease, diabetes, and high blood pressure  High blood pressure causes heart disease and increases the risk of stroke. High  blood pressure is more likely to develop in: ? People who have blood pressure in the high end of the normal range (130-139/85-89 mm Hg). ? People who are overweight or obese. ? People who are African American.  If you are 90-2 years of age, have your blood pressure checked every 3-5 years. If you are 53 years of age or older, have your blood pressure checked every year. You should have your blood pressure measured twice-once when you are at a hospital or clinic, and once when you are not at a hospital or clinic. Record the average of the two measurements. To check your blood pressure when you are not at a hospital or clinic, you can use: ? An automated blood pressure machine at a pharmacy. ? A home blood pressure monitor.  If you are between 37 years and 1 years old, ask your health care provider if you should take aspirin to prevent strokes.  Have regular diabetes screenings. This involves taking a blood sample to  check your fasting blood sugar level. ? If you are at a normal weight and have a low risk for diabetes, have this test once every three years after 58 years of age. ? If you are overweight and have a high risk for diabetes, consider being tested at a younger age or more often. Preventing infection Hepatitis B  If you have a higher risk for hepatitis B, you should be screened for this virus. You are considered at high risk for hepatitis B if: ? You were born in a country where hepatitis B is common. Ask your health care provider which countries are considered high risk. ? Your parents were born in a high-risk country, and you have not been immunized against hepatitis B (hepatitis B vaccine). ? You have HIV or AIDS. ? You use needles to inject street drugs. ? You live with someone who has hepatitis B. ? You have had sex with someone who has hepatitis B. ? You get hemodialysis treatment. ? You take certain medicines for conditions, including cancer, organ transplantation, and autoimmune conditions.  Hepatitis C  Blood testing is recommended for: ? Everyone born from [birth year] through 1966. ? Anyone with known risk factors for hepatitis C.  Sexually transmitted infections (STIs)  You should be screened for sexually transmitted infections (STIs) including gonorrhea and chlamydia if: ? You are sexually active and are younger than 58 years of age. ? You are older than 58 years of age and your health care provider tells you that you are at risk for this type of infection. ? Your sexual activity has changed since you were last screened and you are at an increased risk for chlamydia or gonorrhea. Ask your health care provider if you are at risk.  If you do not have HIV, but are at risk, it may be recommended that you take a prescription medicine daily to prevent HIV infection. This is called pre-exposure prophylaxis (PrEP). You are considered at risk if: ? You are sexually active and do not regularly  use condoms or know the HIV status of your partner(s). ? You take drugs by injection. ? You are sexually active with a partner who has HIV.  Talk with your health care provider about whether you are at high risk of being infected with HIV. If you choose to begin PrEP, you should first be tested for HIV. You should then be tested every 3 months for as long as you are taking PrEP. Pregnancy  If you are  premenopausal and you may become pregnant, ask your health care provider about preconception counseling.  If you may become pregnant, take 400 to 800 micrograms (mcg) of folic acid every day.  If you want to prevent pregnancy, talk to your health care provider about birth control (contraception). Osteoporosis and menopause  Osteoporosis is a disease in which the bones lose minerals and strength with aging. This can result in serious bone fractures. Your risk for osteoporosis can be identified using a bone density scan.  If you are 23 years of age or older, or if you are at risk for osteoporosis and fractures, ask your health care provider if you should be screened.  Ask your health care provider whether you should take a calcium or vitamin D supplement to lower your risk for osteoporosis.  Menopause may have certain physical symptoms and risks.  Hormone replacement therapy may reduce some of these symptoms and risks. Talk to your health care provider about whether hormone replacement therapy is right for you. Follow these instructions at home:  Schedule regular health, dental, and eye exams.  Stay current with your immunizations.  Do not use any tobacco products including cigarettes, chewing tobacco, or electronic cigarettes.  If you are pregnant, do not drink alcohol.  If you are breastfeeding, limit how much and how often you drink alcohol.  Limit alcohol intake to no more than 1 drink per day for nonpregnant women. One drink equals 12 ounces of beer, 5 ounces of wine, or 1 ounces  of hard liquor.  Do not use street drugs.  Do not share needles.  Ask your health care provider for help if you need support or information about quitting drugs.  Tell your health care provider if you often feel depressed.  Tell your health care provider if you have ever been abused or do not feel safe at home. This information is not intended to replace advice given to you by your health care provider. Make sure you discuss any questions you have with your health care provider. Document Released: 06/16/2011 Document Revised: 05/08/2016 Document Reviewed: 09/04/2015 Elsevier Interactive Patient Education  [employer].

## 2018-03-31 ENCOUNTER — Encounter: Payer: 59 | Admitting: Internal Medicine

## 2018-11-19 ENCOUNTER — Other Ambulatory Visit: Payer: Self-pay | Admitting: Internal Medicine

## 2018-11-19 DIAGNOSIS — Z1231 Encounter for screening mammogram for malignant neoplasm of breast: Secondary | ICD-10-CM

## 2018-12-13 DIAGNOSIS — H25813 Combined forms of age-related cataract, bilateral: Secondary | ICD-10-CM | POA: Diagnosis not present

## 2018-12-13 DIAGNOSIS — H04123 Dry eye syndrome of bilateral lacrimal glands: Secondary | ICD-10-CM | POA: Diagnosis not present

## 2018-12-13 DIAGNOSIS — H43811 Vitreous degeneration, right eye: Secondary | ICD-10-CM | POA: Diagnosis not present

## 2018-12-14 NOTE — Progress Notes (Signed)
57 y.o. G0P0 Single White or Caucasian Not Hispanic or Latino female here for annual exam.  No vaginal bleeding. Intermittent GSI, tolerable. Not sexually active.  She is going to have cataract surgery in a couple of months.     Patient's last menstrual period was 10/18/2014.          Sexually active: No.  The current method of family planning is post menopausal status.    Exercising: Yes.    spin, yoga, walking Smoker:  no  Health Maintenance: Pap:  11-01-15 WNL NEG HR HPV, 10-26-14 WNL NEG HR HPV  History of abnormal Pap:  Yes- Hx of dysplasia and colposcopy 1996; 06/2001 LEEP procedure with CIN III MMG: 12/18/2017 Birads 1 negative, scheduled next week Colonoscopy:  06-11-11 repeat in 10 yrs BMD:   Never TDaP:  10-21-13 Gardasil: N/A   reports that she has never smoked. She has never used smokeless tobacco. She reports current alcohol use of about 2.0 standard drinks of alcohol per week. She reports that she does not use drugs. She works at [employer], [employer] work  Past Medical History:  Diagnosis Date  . Abnormal Pap smear   . Chicken pox   . GERD (gastroesophageal reflux disease)   . HX: benign breast biopsy 2002    Past Surgical History:  Procedure Laterality Date  . BREAST SURGERY  2001   benign tumor removed left breast  . CERVICAL BIOPSY  W/ LOOP ELECTRODE EXCISION  06/2001   CIN III  . COLPOSCOPY  07-14-2001   CIN III  . COLPOSCOPY  1996   dysplasia--colposcopy normal  . LASIK  2002  . WISDOM TOOTH EXTRACTION    . WISDOM TOOTH EXTRACTION  04/2017    Current Outpatient Medications  Medication Sig Dispense Refill  . diphenhydrAMINE (BENADRYL) 50 MG tablet Take 50 mg by mouth at bedtime as needed for itching.    . fish oil-omega-3 fatty acids 1000 MG capsule Take 2 g by mouth daily.    . Multiple Vitamins-Minerals (MULTIVITAMIN PO) Take by mouth.    . naproxen sodium (ANAPROX) 220 MG tablet Take 220 mg by mouth as needed.      No current facility-administered  medications for this visit.     Family History  Problem Relation Age of Onset  . Osteoarthritis Mother   . Stroke Father [id number]  . Arthritis Father   . Breast cancer Maternal Aunt        late 60's    Review of Systems  Constitutional: Negative.   HENT: Negative.   Eyes: Negative.   Respiratory: Negative.   Cardiovascular: Negative.   Gastrointestinal: Negative.   Endocrine: Negative.   Genitourinary: Negative.   Musculoskeletal: Negative.   Skin: Negative.   Allergic/Immunologic: Negative.   Neurological: Negative.   Hematological: Negative.   Psychiatric/Behavioral: Negative.     Exam:   BP 114/70 (BP Location: Right Arm, Patient Position: Sitting, Cuff Size: Normal)   Pulse 76   Ht 5' 7.25" (1.708 m)   Wt 178 lb 6.4 oz (80.9 kg)   LMP 10/18/2014   BMI 27.73 kg/m   Weight change: @WEIGHTCHANGE @ Height:   Height: 5' 7.25" (170.8 cm)  Ht Readings from Last 3 Encounters:  12/20/18 5' 7.25" (1.708 m)  03/17/18 5' 7.75" (1.721 m)  11/25/17 5' 7.25" (1.708 m)    General appearance: alert, cooperative and appears stated age Head: Normocephalic, without obvious abnormality, atraumatic Neck: no adenopathy, supple, symmetrical, trachea midline and thyroid normal to inspection and palpation  Lungs: clear to auscultation bilaterally Cardiovascular: regular rate and rhythm Breasts: normal appearance, no masses or tenderness Abdomen: soft, non-tender; non distended,  no masses,  no organomegaly Extremities: extremities normal, atraumatic, no cyanosis or edema Skin: Skin color, texture, turgor normal. No rashes or lesions Lymph nodes: Cervical, supraclavicular, and axillary nodes normal. No abnormal inguinal nodes palpated Neurologic: Grossly normal   Pelvic: External genitalia:  no lesions              Urethra:  normal appearing urethra with no masses, tenderness or lesions              Bartholins and Skenes: normal                 Vagina: atrophic appearing vagina with  normal color and discharge, no lesions              Cervix: no lesions               Bimanual Exam:  Uterus:  normal size, contour, position, consistency, mobility, non-tender              Adnexa: no mass, fullness, tenderness               Rectovaginal: Confirms               Anus:  normal sphincter tone, no lesions  Chaperone was present for exam.  A:  Well Woman with normal exam  P:   Pap with hpv  Labs with primary  Mammogram next week  Colonoscopy UTD  Discussed breast self exam  Discussed calcium and vit D intake

## 2018-12-20 ENCOUNTER — Other Ambulatory Visit: Payer: Self-pay

## 2018-12-20 ENCOUNTER — Encounter: Payer: Self-pay | Admitting: Obstetrics and Gynecology

## 2018-12-20 ENCOUNTER — Ambulatory Visit (INDEPENDENT_AMBULATORY_CARE_PROVIDER_SITE_OTHER): Payer: BLUE CROSS/BLUE SHIELD | Admitting: Obstetrics and Gynecology

## 2018-12-20 ENCOUNTER — Other Ambulatory Visit (HOSPITAL_COMMUNITY)
Admission: RE | Admit: 2018-12-20 | Discharge: 2018-12-20 | Disposition: A | Discharge status: Home / Self Care | Payer: BLUE CROSS/BLUE SHIELD | Source: Ambulatory Visit | Attending: Obstetrics and Gynecology | Admitting: Obstetrics and Gynecology

## 2018-12-20 VITALS — BP 114/70 | HR 76 | Ht 67.25 in | Wt 178.4 lb

## 2018-12-20 DIAGNOSIS — Z124 Encounter for screening for malignant neoplasm of cervix: Secondary | ICD-10-CM | POA: Diagnosis not present

## 2018-12-20 DIAGNOSIS — Z01419 Encounter for gynecological examination (general) (routine) without abnormal findings: Secondary | ICD-10-CM | POA: Diagnosis not present

## 2018-12-20 NOTE — Patient Instructions (Signed)

## 2018-12-22 LAB — CYTOLOGY - PAP
DIAGNOSIS: NEGATIVE
HPV: NOT DETECTED

## 2018-12-29 ENCOUNTER — Ambulatory Visit
Admission: RE | Admit: 2018-12-29 | Discharge: 2018-12-29 | Disposition: A | Discharge status: Home / Self Care | Payer: BLUE CROSS/BLUE SHIELD | Source: Ambulatory Visit | Attending: Internal Medicine | Admitting: Internal Medicine

## 2018-12-29 DIAGNOSIS — Z1231 Encounter for screening mammogram for malignant neoplasm of breast: Secondary | ICD-10-CM | POA: Diagnosis not present

## 2019-01-10 DIAGNOSIS — H2511 Age-related nuclear cataract, right eye: Secondary | ICD-10-CM | POA: Diagnosis not present

## 2019-01-25 DIAGNOSIS — H268 Other specified cataract: Secondary | ICD-10-CM | POA: Diagnosis not present

## 2019-01-25 DIAGNOSIS — H2511 Age-related nuclear cataract, right eye: Secondary | ICD-10-CM | POA: Diagnosis not present

## 2019-01-25 DIAGNOSIS — H25811 Combined forms of age-related cataract, right eye: Secondary | ICD-10-CM | POA: Diagnosis not present

## 2019-11-16 ENCOUNTER — Other Ambulatory Visit: Payer: Self-pay | Admitting: Internal Medicine

## 2019-11-16 DIAGNOSIS — Z1231 Encounter for screening mammogram for malignant neoplasm of breast: Secondary | ICD-10-CM

## 2019-12-26 NOTE — Progress Notes (Signed)
59 y.o. G0P0 Single White or Caucasian Not Hispanic or Latino female here for annual exam. No vaginal bleeding. Not sexually active.   Some GSI, overall it's better. Tolerable. No bowel c/o.     Patient's last menstrual period was 10/18/2014.          Sexually active: No.  The current method of family planning is post menopausal status.    Exercising: Yes.    walks about 1.5 to 2 miles a day She also is working on her balance Smoker:  no  Health Maintenance: Pap:  12/20/2018 normal HPV Neg11-17-16 WNL NEG HR HPV, 10-26-14 WNL NEG HR HPV History of abnormal Pap:  Yes Hx of dysplasia and colposcopy 1996; 06/2001 LEEP  For CIN III MMG:  12/29/18 Density C Bi-rads 1 neg, Scheduled   BMD:   None  Colonoscopy: 06-11-11 repeat in 10 yrs TDaP:  10-21-13 Gardasil: na   reports that she has never smoked. She has never used smokeless tobacco. She reports current alcohol use of about 2.0 standard drinks of alcohol per week. She reports that she does not use drugs. She works at [employer], Teaching laboratory technician work  Past Medical History:  Diagnosis Date  . Abnormal Pap smear   . Chicken pox   . GERD (gastroesophageal reflux disease)   . HX: benign breast biopsy 2002    Past Surgical History:  Procedure Laterality Date  . BREAST SURGERY  2001   benign tumor removed left breast  . CERVICAL BIOPSY  W/ LOOP ELECTRODE EXCISION  06/2001   CIN III  . COLPOSCOPY  07-14-2001   CIN III  . COLPOSCOPY  1996   dysplasia--colposcopy normal  . LASIK  2002  . WISDOM TOOTH EXTRACTION    . WISDOM TOOTH EXTRACTION  04/2017    Current Outpatient Medications  Medication Sig Dispense Refill  . diphenhydrAMINE (BENADRYL) 50 MG tablet Take 50 mg by mouth at bedtime as needed for itching.    . fish oil-omega-3 fatty acids 1000 MG capsule Take 2 g by mouth daily.    . Multiple Vitamins-Minerals (MULTIVITAMIN PO) Take by mouth.    . naproxen sodium (ANAPROX) 220 MG tablet Take 220 mg by mouth as needed.      No current  facility-administered medications for this visit.    Family History  Problem Relation Age of Onset  . Osteoarthritis Mother   . Stroke Father 30  . Arthritis Father   . Breast cancer Maternal Aunt        late 60's    Review of Systems  Constitutional: Negative.   HENT: Negative.   Eyes: Negative.   Respiratory: Negative.   Cardiovascular: Negative.   Gastrointestinal: Negative.   Endocrine: Negative.   Genitourinary: Negative.   Musculoskeletal: Negative.   Skin: Negative.   Allergic/Immunologic: Negative.   Neurological: Negative.   Hematological: Negative.   Psychiatric/Behavioral: Negative.     Exam:   LMP 10/18/2014   Weight change: @WEIGHTCHANGE @ Height:      Ht Readings from Last 3 Encounters:  12/20/18 5' 7.25" (1.708 m)  03/17/18 5' 7.75" (1.721 m)  11/25/17 5' 7.25" (1.708 m)    General appearance: alert, cooperative and appears stated age Head: Normocephalic, without obvious abnormality, atraumatic Neck: no adenopathy, supple, symmetrical, trachea midline and thyroid normal to inspection and palpation Lungs: clear to auscultation bilaterally Cardiovascular: regular rate and rhythm Breasts: normal appearance, no masses or tenderness Abdomen: soft, non-tender; non distended,  no masses,  no organomegaly Extremities: extremities normal, atraumatic,  no cyanosis or edema Skin: Skin color, texture, turgor normal. No rashes or lesions Lymph nodes: Cervical, supraclavicular, and axillary nodes normal. No abnormal inguinal nodes palpated Neurologic: Grossly normal   Pelvic: External genitalia:  no lesions              Urethra:  normal appearing urethra with no masses, tenderness or lesions              Bartholins and Skenes: normal                 Vagina: atrophic appearing vagina with normal color and discharge, no lesions              Cervix: no lesions               Bimanual Exam:  Uterus:  normal size, contour, position, consistency, mobility,  non-tender              Adnexa: no mass, fullness, tenderness               Rectovaginal: Confirms               Anus:  normal sphincter tone, no lesions  Carolynn Serve chaperoned for the exam.  A:  Well Woman with normal exam  P:   No pap this year  Mammogram scheduled  Colonoscopy in 6/22  Discussed breast self exam  Discussed calcium and vit D intake  She will get her labs with her primary

## 2019-12-27 ENCOUNTER — Other Ambulatory Visit: Payer: Self-pay

## 2019-12-28 ENCOUNTER — Encounter: Payer: Self-pay | Admitting: Obstetrics and Gynecology

## 2019-12-28 ENCOUNTER — Ambulatory Visit: Payer: BC Managed Care – PPO | Admitting: Obstetrics and Gynecology

## 2019-12-28 VITALS — BP 124/64 | HR 51 | Temp 98.1°F | Ht 67.0 in | Wt 172.0 lb

## 2019-12-28 DIAGNOSIS — Z01419 Encounter for gynecological examination (general) (routine) without abnormal findings: Secondary | ICD-10-CM | POA: Diagnosis not present

## 2019-12-28 NOTE — Patient Instructions (Signed)

## 2020-01-05 ENCOUNTER — Other Ambulatory Visit: Payer: Self-pay

## 2020-01-05 ENCOUNTER — Ambulatory Visit
Admission: RE | Admit: 2020-01-05 | Discharge: 2020-01-05 | Disposition: A | Discharge status: Home / Self Care | Payer: BLUE CROSS/BLUE SHIELD | Source: Ambulatory Visit | Attending: Internal Medicine | Admitting: Internal Medicine

## 2020-01-05 DIAGNOSIS — Z1231 Encounter for screening mammogram for malignant neoplasm of breast: Secondary | ICD-10-CM | POA: Diagnosis not present

## 2020-01-30 ENCOUNTER — Other Ambulatory Visit: Payer: Self-pay

## 2020-01-30 NOTE — Progress Notes (Signed)
This visit occurred during the SARS-CoV-2 public health emergency.  Safety protocols were in place, including screening questions prior to the visit, additional usage of staff PPE, and extensive cleaning of exam room while observing appropriate contact time as indicated for disinfecting solutions.    Chief Complaint  Patient presents with  . Annual Exam    HPI: Patient  Deborah Lang  59 y.o. comes in today for [place] visit  Last PV 4 19  Well women 1 21  Check spot right hand for a few months ? If a wart not growing   Health Maintenance  Topic Date Due  . INFLUENZA VACCINE  07/16/2019  . COLONOSCOPY  06/10/2021  . PAP SMEAR-Modifier  12/20/2021  . MAMMOGRAM  01/04/2022  . TETANUS/TDAP  10/22/2023  . Hepatitis C Screening  Completed  . HIV Screening  Completed   Health Maintenance Review LIFESTYLE:  Exercise:   Walking   Almost 2 miles   3 x per week Tobacco/ETS: no Alcohol:   Low  Sugar beverages:   One or 2 per week. Sleep:  Pandemic  5-8  Drug use: no HH of  1 cat Work:  Working home 41 from  Home   From May.     ROS:  GEN/ HEENT: No fever, significant weight changes sweats headaches vision problems hearing changes, CV/ PULM; No chest pain shortness of breath cough, syncope,edema  change in exercise tolerance. GI /GU: No adominal pain, vomiting, change in bowel habits. No blood in the stool. No significant GU symptoms. SKIN/HEME: ,no acute skin rashes suspicious lesions or bleeding. No lymphadenopathy, nodules, masses.  NEURO/ PSYCH:  No neurologic signs such as weakness numbness. No depression anxiety. IMM/ Allergy: No unusual infections.  Allergy .   REST of 12 system review negative except as per HPI   Past Medical History:  Diagnosis Date  . Abnormal Pap smear   . Chicken pox   . GERD (gastroesophageal reflux disease)   . HX: benign breast biopsy 2002    Past Surgical History:  Procedure Laterality Date  . BREAST EXCISIONAL BIOPSY  Left 08/30/2001  . BREAST SURGERY  2001   benign tumor removed left breast  . CERVICAL BIOPSY  W/ LOOP ELECTRODE EXCISION  06/2001   CIN III  . COLPOSCOPY  07-14-2001   CIN III  . COLPOSCOPY  1996   dysplasia--colposcopy normal  . LASIK  2002  . WISDOM TOOTH EXTRACTION    . WISDOM TOOTH EXTRACTION  04/2017    Family History  Problem Relation Age of Onset  . Osteoarthritis Mother   . Stroke Father 65  . Arthritis Father   . Breast cancer Maternal Aunt        in 70's      Outpatient Medications Prior to Visit  Medication Sig Dispense Refill  . fish oil-omega-3 fatty acids 1000 MG capsule Take 2 g by mouth daily.    . Multiple Vitamins-Minerals (MULTIVITAMIN PO) Take by mouth.    . naproxen sodium (ANAPROX) 220 MG tablet Take 220 mg by mouth as needed.      No facility-administered medications prior to visit.     EXAM:  BP 110/70 (BP Location: Right Arm, Patient Position: Sitting, Cuff Size: Normal)   Pulse 75   Temp 97.6 F (36.4 C) (Temporal)   Ht 5' 7.32" (1.71 m)   Wt 173 lb 4 oz (78.6 kg)   LMP 10/18/2014   SpO2 99%   BMI 26.88 kg/m  Body mass index is 26.88 kg/m. Wt Readings from Last 3 Encounters:  01/31/20 173 lb 4 oz (78.6 kg)  12/28/19 172 lb (78 kg)  12/20/18 178 lb 6.4 oz (80.9 kg)    Physical Exam: Vital signs reviewed TTS:VXBL is a well-developed well-nourished alert cooperative    who appearsr stated age in no acute distress.  HEENT: normocephalic atraumatic , Eyes: PERRL EOM's full, conjunctiva clear, , Ears: no deformity EAC's clear TMs with normal landmarks. Mouth masked  NECK: supple without masses, thyromegaly or bruits. CHEST/PULM:  Clear to auscultation and percussion breath sounds equal no wheeze , rales or rhonchi. No chest wall deformities or tenderness. Breast: normal by inspection . No dimpling, discharge, masses, tenderness or discharge . CV: PMI is nondisplaced, S1 S2 no gallops, murmurs, rubs. Peripheral pulses are full without  delay.No JVD .  ABDOMEN: Bowel sounds normal nontender  No guard or rebound, no hepato splenomegal no CVA tenderness.  No hernia. Extremtities:  No clubbing cyanosis or edema, no acute joint swelling or redness no focal atrophy NEURO:  Oriented x3, cranial nerves 3-12 appear to be intact, no obvious focal weakness,gait within normal limits no abnormal reflexes or asymmetrical SKIN: No acute rashes normal turgor, color, no bruising or petechiae.right dorsal hand  2-3 mm papule pink flesh with scan white scale in center  PSYCH: Oriented, good eye contact, no obvious depression anxiety, cognition and judgment appear normal. LN: no cervical axillary inguinal adenopathy  Lab Results  Component Value Date   WBC 5.6 03/17/2018   HGB 14.2 03/17/2018   HCT 42.6 03/17/2018   PLT 215.0 03/17/2018   GLUCOSE 88 03/17/2018   CHOL 188 03/17/2018   TRIG 122.0 03/17/2018   HDL 69.80 03/17/2018   LDLCALC 94 03/17/2018   ALT 22 03/17/2018   AST 23 03/17/2018   NA 140 03/17/2018   K 4.3 03/17/2018   CL 103 03/17/2018   CREATININE 0.79 03/17/2018   BUN 20 03/17/2018   CO2 31 03/17/2018   TSH 1.31 03/17/2018   Wt Readings from Last 3 Encounters:  01/31/20 173 lb 4 oz (78.6 kg)  12/28/19 172 lb (78 kg)  12/20/18 178 lb 6.4 oz (80.9 kg)    BP Readings from Last 3 Encounters:  01/31/20 110/70  12/28/19 124/64  12/20/18 114/70    Lab plan  Fasting today  reviewed with patient   ASSESSMENT AND PLAN:  Discussed the following assessment and plan:    ICD-10-CM   1. Visit for preventive health examination  T90.30 Basic metabolic panel    CBC with Differential/Platelet    Hepatic function panel    Lipid panel    TSH  2. Screening cholesterol level  Z13.220 Lipid panel  3. Skin lesion  L98.9    wart  fu if progressive and follow  disc bcca features     Patient Care Team: Valgene Deloatch, Standley Brooking, MD as PCP - General (Internal Medicine) Nunzio Cobbs, MD as Consulting Physician  (Obstetrics and Gynecology) Calvert Cantor, MD as Consulting Physician (Ophthalmology) Juanita Craver, MD as Consulting Physician (Gastroenterology) Patient Instructions  Continue lifestyle intervention healthy eating and exercise . Fasting lab  At [facility].  Not sure of area on hand  Watch and consider seeing dermatologist ( esp if getting angry more scaling etc)  consider getting shingrix vaccine  Can call for injection only appt      Preventive Care 45-50 Years Old, Female Preventive care refers to visits with your health care  provider and lifestyle choices that can promote health and wellness. This includes:  A yearly physical exam. This may also be called an annual well check.  Regular dental visits and eye exams.  Immunizations.  Screening for certain conditions.  Healthy lifestyle choices, such as eating a healthy diet, getting regular exercise, not using drugs or products that contain nicotine and tobacco, and limiting alcohol use. What can I expect for my preventive care visit? Physical exam Your health care provider will check your:  Height and weight. This may be used to calculate body mass index (BMI), which tells if you are at a healthy weight.  Heart rate and blood pressure.  Skin for abnormal spots. Counseling Your health care provider may ask you questions about your:  Alcohol, tobacco, and drug use.  Emotional well-being.  Home and relationship well-being.  Sexual activity.  Eating habits.  Work and work Statistician.  Method of birth control.  Menstrual cycle.  Pregnancy history. What immunizations do I need?  Influenza (flu) vaccine  This is recommended every year. Tetanus, diphtheria, and pertussis (Tdap) vaccine  You may need a Td booster every 10 years. Varicella (chickenpox) vaccine  You may need this if you have not been vaccinated. Zoster (shingles) vaccine  You may need this after age 80. Measles, mumps, and rubella (MMR)  vaccine  You may need at least one dose of MMR if you were born in 1957 or later. You may also need a second dose. Pneumococcal conjugate (PCV13) vaccine  You may need this if you have certain conditions and were not previously vaccinated. Pneumococcal polysaccharide (PPSV23) vaccine  You may need one or two doses if you smoke cigarettes or if you have certain conditions. Meningococcal conjugate (MenACWY) vaccine  You may need this if you have certain conditions. Hepatitis A vaccine  You may need this if you have certain conditions or if you travel or work in places where you may be exposed to hepatitis A. Hepatitis B vaccine  You may need this if you have certain conditions or if you travel or work in places where you may be exposed to hepatitis B. Haemophilus influenzae type b (Hib) vaccine  You may need this if you have certain conditions. Human papillomavirus (HPV) vaccine  If recommended by your health care provider, you may need three doses over 6 months. You may receive vaccines as individual doses or as more than one vaccine together in one shot (combination vaccines). Talk with your health care provider about the risks and benefits of combination vaccines. What tests do I need? Blood tests  Lipid and cholesterol levels. These may be checked every 5 years, or more frequently if you are over 81 years old.  Hepatitis C test.  Hepatitis B test. Screening  Lung cancer screening. You may have this screening every year starting at age 23 if you have a 30-pack-year history of smoking and currently smoke or have quit within the past 15 years.  Colorectal cancer screening. All adults should have this screening starting at age 79 and continuing until age 26. Your health care provider may recommend screening at age 85 if you are at increased risk. You will have tests every 1-10 years, depending on your results and the type of screening test.  Diabetes screening. This is done by  checking your blood sugar (glucose) after you have not eaten for a while (fasting). You may have this done every 1-3 years.  Mammogram. This may be done every 1-2 years. Talk  with your health care provider about when you should start having regular mammograms. This may depend on whether you have a family history of breast cancer.  BRCA-related cancer screening. This may be done if you have a family history of breast, ovarian, tubal, or peritoneal cancers.  Pelvic exam and Pap test. This may be done every 3 years starting at age 77. Starting at age 79, this may be done every 5 years if you have a Pap test in combination with an HPV test. Other tests  Sexually transmitted disease (STD) testing.  Bone density scan. This is done to screen for osteoporosis. You may have this scan if you are at high risk for osteoporosis. Follow these instructions at home: Eating and drinking  Eat a diet that includes fresh fruits and vegetables, whole grains, lean protein, and low-fat dairy.  Take vitamin and mineral supplements as recommended by your health care provider.  Do not drink alcohol if: ? Your health care provider tells you not to drink. ? You are pregnant, may be pregnant, or are planning to become pregnant.  If you drink alcohol: ? Limit how much you have to 0-1 drink a day. ? Be aware of how much alcohol is in your drink. In the [place] one drink equals one 12 oz bottle of beer (355 mL), one 5 oz glass of wine (148 mL), or one 1 oz glass of hard liquor (44 mL). Lifestyle  Take daily care of your teeth and gums.  Stay active. Exercise for at least 30 minutes on 5 or more days each week.  Do not use any products that contain nicotine or tobacco, such as cigarettes, e-cigarettes, and chewing tobacco. If you need help quitting, ask your health care provider.  If you are sexually active, practice safe sex. Use a condom or other form of birth control (contraception) in order to prevent pregnancy  and STIs (sexually transmitted infections).  If told by your health care provider, take low-dose aspirin daily starting at age 60. What's next?  Visit your health care provider once a year for a well check visit.  Ask your health care provider how often you should have your eyes and teeth checked.  Stay up to date on all vaccines. This information is not intended to replace advice given to you by your health care provider. Make sure you discuss any questions you have with your health care provider. Document Revised: 08/12/2018 Document Reviewed: 08/12/2018 Elsevier Patient Education  2020 Naturita Zarin Knupp M.D.

## 2020-01-31 ENCOUNTER — Other Ambulatory Visit (INDEPENDENT_AMBULATORY_CARE_PROVIDER_SITE_OTHER): Payer: BC Managed Care – PPO

## 2020-01-31 ENCOUNTER — Encounter: Payer: Self-pay | Admitting: Internal Medicine

## 2020-01-31 ENCOUNTER — Ambulatory Visit (INDEPENDENT_AMBULATORY_CARE_PROVIDER_SITE_OTHER): Payer: BC Managed Care – PPO | Admitting: Internal Medicine

## 2020-01-31 VITALS — BP 110/70 | HR 75 | Temp 97.6°F | Ht 67.32 in | Wt 173.2 lb

## 2020-01-31 DIAGNOSIS — Z13220 Encounter for screening for lipoid disorders: Secondary | ICD-10-CM

## 2020-01-31 DIAGNOSIS — Z0000 Encounter for general adult medical examination without abnormal findings: Secondary | ICD-10-CM

## 2020-01-31 DIAGNOSIS — L989 Disorder of the skin and subcutaneous tissue, unspecified: Secondary | ICD-10-CM | POA: Diagnosis not present

## 2020-01-31 LAB — BASIC METABOLIC PANEL
BUN: 16 mg/dL (ref 6–23)
CO2: 29 mEq/L (ref 19–32)
Calcium: 9.4 mg/dL (ref 8.4–10.5)
Chloride: 104 mEq/L (ref 96–112)
Creatinine, Ser: 0.85 mg/dL (ref 0.40–1.20)
GFR: 68.25 mL/min (ref >60.00)
Glucose, Bld: 100 mg/dL — ABNORMAL HIGH (ref 70–99)
Potassium: 4.1 mEq/L (ref 3.5–5.1)
Sodium: 139 mEq/L (ref 135–145)

## 2020-01-31 LAB — CBC WITH DIFFERENTIAL/PLATELET
Basophils Absolute: 0 10*3/uL (ref 0.0–0.1)
Basophils Relative: 0.6 % (ref 0.0–3.0)
Eosinophils Absolute: 0 10*3/uL (ref 0.0–0.7)
Eosinophils Relative: 0.6 % (ref 0.0–5.0)
HCT: 42.2 % (ref 36.0–46.0)
Hemoglobin: 13.9 g/dL (ref 12.0–15.0)
Lymphocytes Relative: 25.9 % (ref 12.0–46.0)
Lymphs Abs: 1.7 10*3/uL (ref 0.7–4.0)
MCHC: 33 g/dL (ref 30.0–36.0)
MCV: 90.1 fl (ref 78.0–100.0)
Monocytes Absolute: 0.5 10*3/uL (ref 0.1–1.0)
Monocytes Relative: 7.1 % (ref 3.0–12.0)
Neutro Abs: 4.2 10*3/uL (ref 1.4–7.7)
Neutrophils Relative %: 65.8 % (ref 43.0–77.0)
Platelets: 209 10*3/uL (ref 150.0–400.0)
RBC: 4.68 Mil/uL (ref 3.87–5.11)
RDW: 13 % (ref 11.5–15.5)
WBC: 6.4 10*3/uL (ref 4.0–10.5)

## 2020-01-31 LAB — LIPID PANEL
Cholesterol: 205 mg/dL — ABNORMAL HIGH (ref 0–200)
HDL: 71.9 mg/dL (ref >39.00)
LDL Cholesterol: 114 mg/dL — ABNORMAL HIGH (ref 0–99)
NonHDL: 132.71
Total CHOL/HDL Ratio: 3
Triglycerides: 93 mg/dL (ref 0.0–149.0)
VLDL: 18.6 mg/dL (ref 0.0–40.0)

## 2020-01-31 LAB — HEPATIC FUNCTION PANEL
ALT: 19 U/L (ref 0–35)
AST: 22 U/L (ref 0–37)
Albumin: 4.3 g/dL (ref 3.5–5.2)
Alkaline Phosphatase: 74 U/L (ref 39–117)
Bilirubin, Direct: 0.1 mg/dL (ref 0.0–0.3)
Total Bilirubin: 0.8 mg/dL (ref 0.2–1.2)
Total Protein: 7.2 g/dL (ref 6.0–8.3)

## 2020-01-31 LAB — TSH: TSH: 1.21 u[IU]/mL (ref 0.35–4.50)

## 2020-01-31 NOTE — Patient Instructions (Addendum)
Continue lifestyle intervention healthy eating and exercise . Fasting lab  At [facility].  Not sure of area on hand  Watch and consider seeing dermatologist ( esp if getting angry more scaling etc)  consider getting shingrix vaccine  Can call for injection only appt      Preventive Care 72-60 Years Old, Female Preventive care refers to visits with your health care provider and lifestyle choices that can promote health and wellness. This includes:  A yearly physical exam. This may also be called an annual well check.  Regular dental visits and eye exams.  Immunizations.  Screening for certain conditions.  Healthy lifestyle choices, such as eating a healthy diet, getting regular exercise, not using drugs or products that contain nicotine and tobacco, and limiting alcohol use. What can I expect for my preventive care visit? Physical exam Your health care provider will check your:  Height and weight. This may be used to calculate body mass index (BMI), which tells if you are at a healthy weight.  Heart rate and blood pressure.  Skin for abnormal spots. Counseling Your health care provider may ask you questions about your:  Alcohol, tobacco, and drug use.  Emotional well-being.  Home and relationship well-being.  Sexual activity.  Eating habits.  Work and work Statistician.  Method of birth control.  Menstrual cycle.  Pregnancy history. What immunizations do I need?  Influenza (flu) vaccine  This is recommended every year. Tetanus, diphtheria, and pertussis (Tdap) vaccine  You may need a Td booster every 10 years. Varicella (chickenpox) vaccine  You may need this if you have not been vaccinated. Zoster (shingles) vaccine  You may need this after age 40. Measles, mumps, and rubella (MMR) vaccine  You may need at least one dose of MMR if you were born in 1957 or later. You may also need a second dose. Pneumococcal conjugate (PCV13) vaccine  You may need  this if you have certain conditions and were not previously vaccinated. Pneumococcal polysaccharide (PPSV23) vaccine  You may need one or two doses if you smoke cigarettes or if you have certain conditions. Meningococcal conjugate (MenACWY) vaccine  You may need this if you have certain conditions. Hepatitis A vaccine  You may need this if you have certain conditions or if you travel or work in places where you may be exposed to hepatitis A. Hepatitis B vaccine  You may need this if you have certain conditions or if you travel or work in places where you may be exposed to hepatitis B. Haemophilus influenzae type b (Hib) vaccine  You may need this if you have certain conditions. Human papillomavirus (HPV) vaccine  If recommended by your health care provider, you may need three doses over 6 months. You may receive vaccines as individual doses or as more than one vaccine together in one shot (combination vaccines). Talk with your health care provider about the risks and benefits of combination vaccines. What tests do I need? Blood tests  Lipid and cholesterol levels. These may be checked every 5 years, or more frequently if you are over 60 years old.  Hepatitis C test.  Hepatitis B test. Screening  Lung cancer screening. You may have this screening every year starting at age 60 if you have a 30-pack-year history of smoking and currently smoke or have quit within the past 15 years.  Colorectal cancer screening. All adults should have this screening starting at age 60 and continuing until age 60. Your health care provider may recommend  screening at age 44 if you are at increased risk. You will have tests every 1-10 years, depending on your results and the type of screening test.  Diabetes screening. This is done by checking your blood sugar (glucose) after you have not eaten for a while (fasting). You may have this done every 1-3 years.  Mammogram. This may be done every 1-2 years.  Talk with your health care provider about when you should start having regular mammograms. This may depend on whether you have a family history of breast cancer.  BRCA-related cancer screening. This may be done if you have a family history of breast, ovarian, tubal, or peritoneal cancers.  Pelvic exam and Pap test. This may be done every 3 years starting at age 60. Starting at age 60, this may be done every 5 years if you have a Pap test in combination with an HPV test. Other tests  Sexually transmitted disease (STD) testing.  Bone density scan. This is done to screen for osteoporosis. You may have this scan if you are at high risk for osteoporosis. Follow these instructions at home: Eating and drinking  Eat a diet that includes fresh fruits and vegetables, whole grains, lean protein, and low-fat dairy.  Take vitamin and mineral supplements as recommended by your health care provider.  Do not drink alcohol if: ? Your health care provider tells you not to drink. ? You are pregnant, may be pregnant, or are planning to become pregnant.  If you drink alcohol: ? Limit how much you have to 0-1 drink a day. ? Be aware of how much alcohol is in your drink. In the [place] one drink equals one 12 oz bottle of beer (355 mL), one 5 oz glass of wine (148 mL), or one 1 oz glass of hard liquor (44 mL). Lifestyle  Take daily care of your teeth and gums.  Stay active. Exercise for at least 30 minutes on 5 or more days each week.  Do not use any products that contain nicotine or tobacco, such as cigarettes, e-cigarettes, and chewing tobacco. If you need help quitting, ask your health care provider.  If you are sexually active, practice safe sex. Use a condom or other form of birth control (contraception) in order to prevent pregnancy and STIs (sexually transmitted infections).  If told by your health care provider, take low-dose aspirin daily starting at age 60. What's next?  Visit your health care  provider once a year for a well check visit.  Ask your health care provider how often you should have your eyes and teeth checked.  Stay up to date on all vaccines. This information is not intended to replace advice given to you by your health care provider. Make sure you discuss any questions you have with your health care provider. Document Revised: 08/12/2018 Document Reviewed: 08/12/2018 Elsevier Patient Education  2020 [employer].

## 2020-01-31 NOTE — Progress Notes (Signed)
Cholesterol slightly up from last time and blood sugar is borderline Continue attention to lifestyle intervention healthy eating and exercise . Avoid simple sugars  and processed carbohydrates . Rest of results are normal range including liver kidney and thyroid  and no anemia

## 2020-05-09 DIAGNOSIS — H25812 Combined forms of age-related cataract, left eye: Secondary | ICD-10-CM | POA: Diagnosis not present

## 2020-05-09 DIAGNOSIS — H43813 Vitreous degeneration, bilateral: Secondary | ICD-10-CM | POA: Diagnosis not present

## 2020-05-09 DIAGNOSIS — H04123 Dry eye syndrome of bilateral lacrimal glands: Secondary | ICD-10-CM | POA: Diagnosis not present

## 2020-05-13 IMAGING — MG DIGITAL SCREENING BILATERAL MAMMOGRAM WITH TOMO AND CAD
8 series · 9 of 24 positions shown · non-contrast
Comparison: Previous exam(s).

CLINICAL DATA: Screening.

EXAM:
DIGITAL SCREENING BILATERAL MAMMOGRAM WITH TOMO AND CAD

[L CC synth-2D]
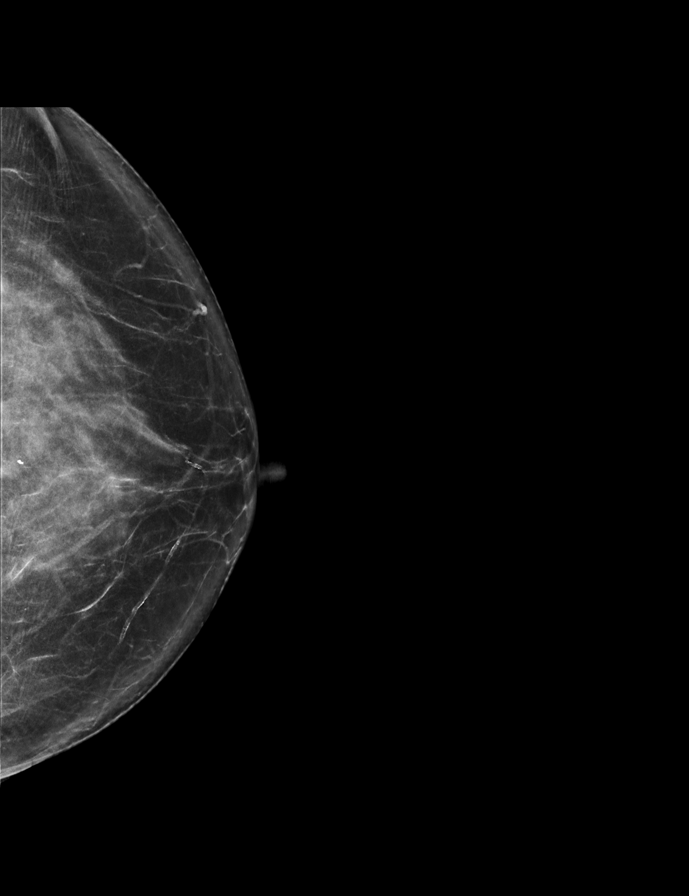

[R CC synth-2D]
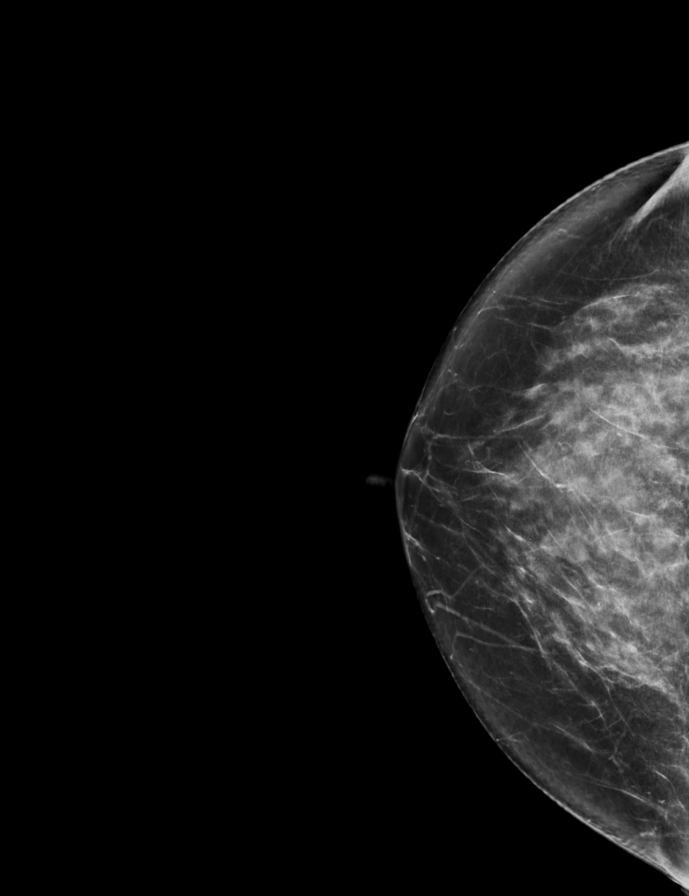

[R MLO synth-2D]
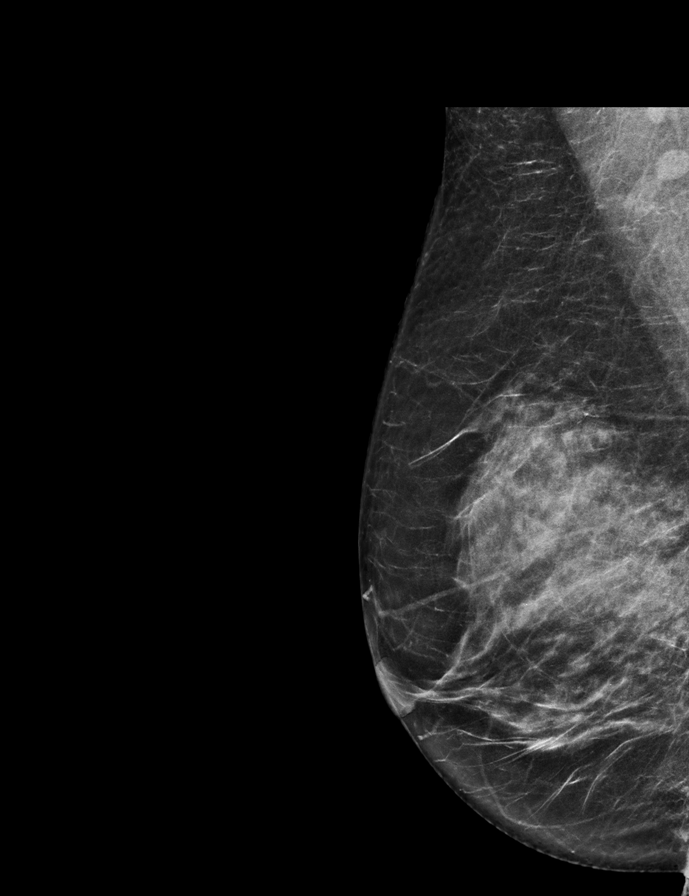

[L MLO synth-2D]
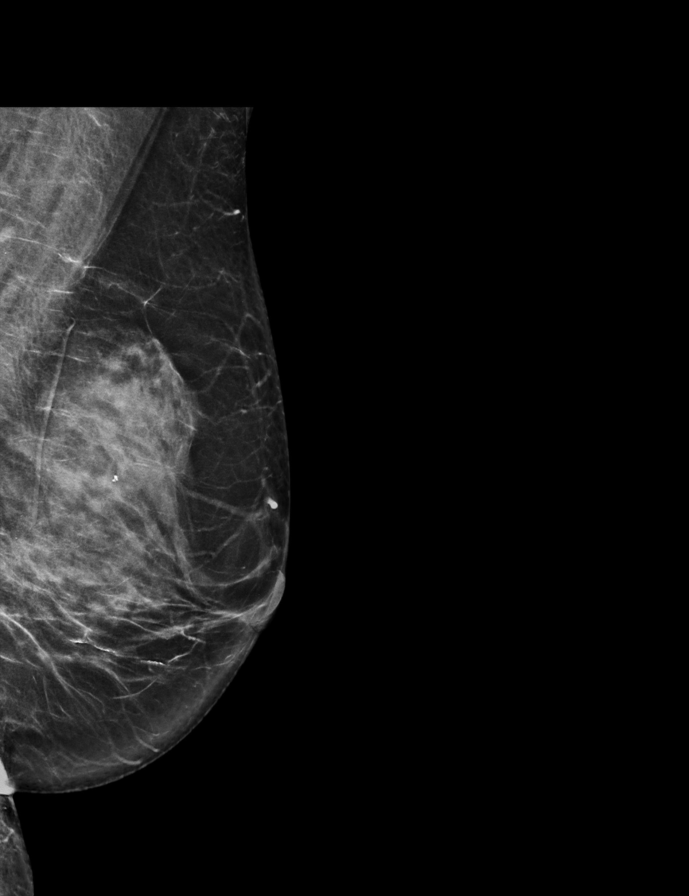

[L MLO tomo · 2 of 69 frames shown]
[frame 23/69]
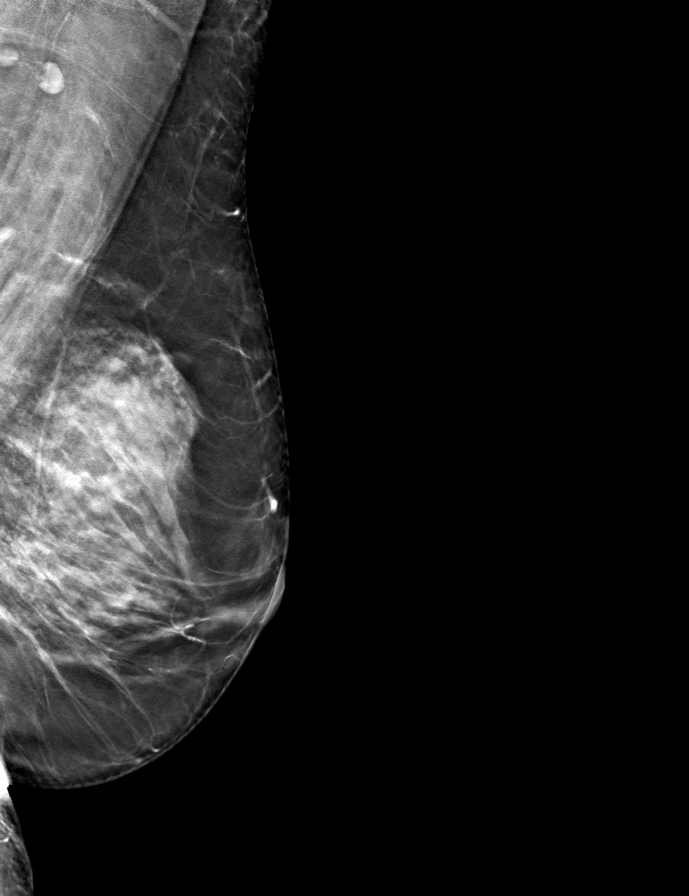
[frame 35/69]
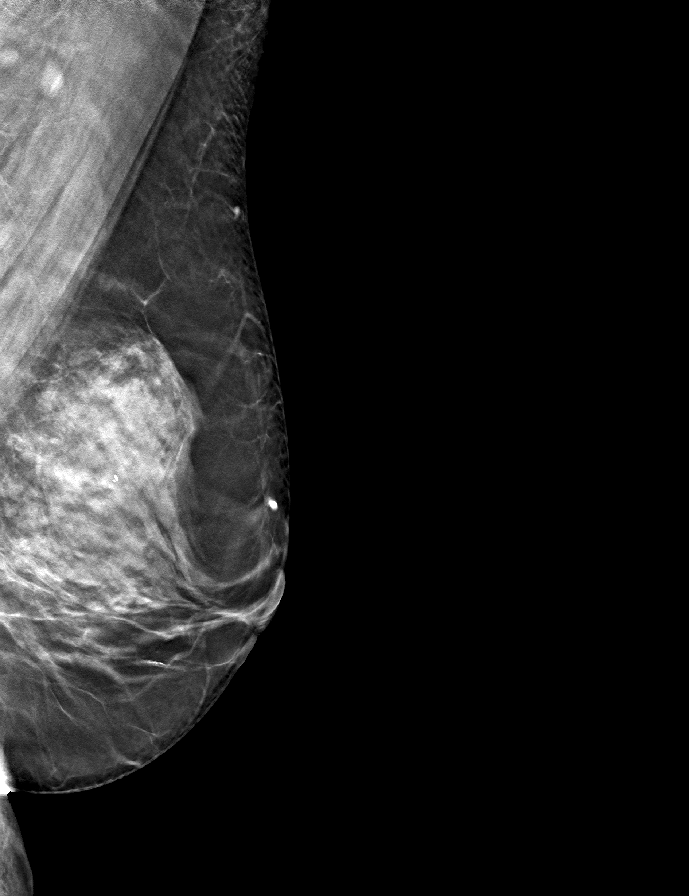

[L CC tomo · tomo slice 34/67.0]
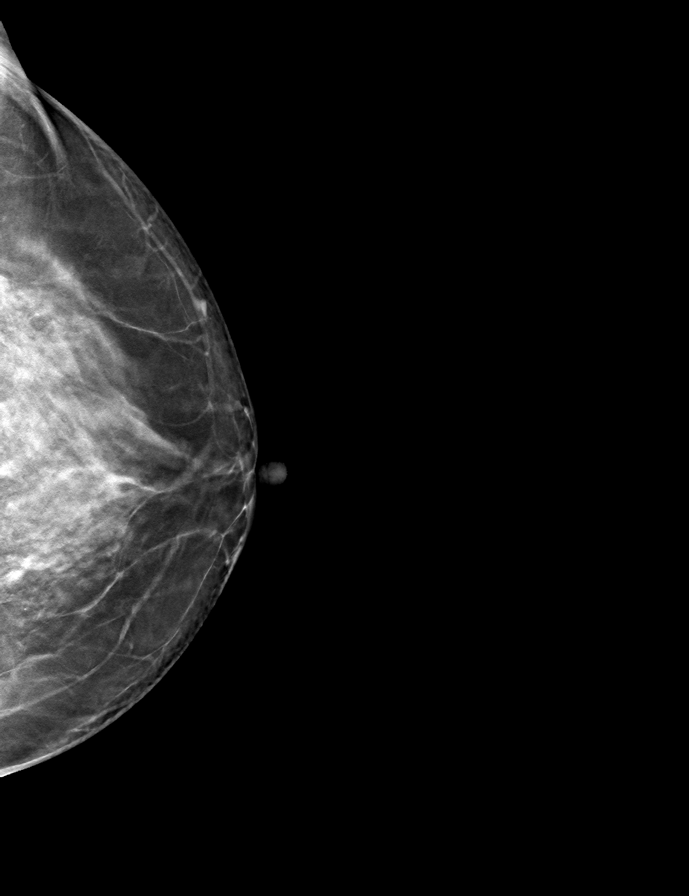

[R CC tomo · tomo slice 36/71.0]
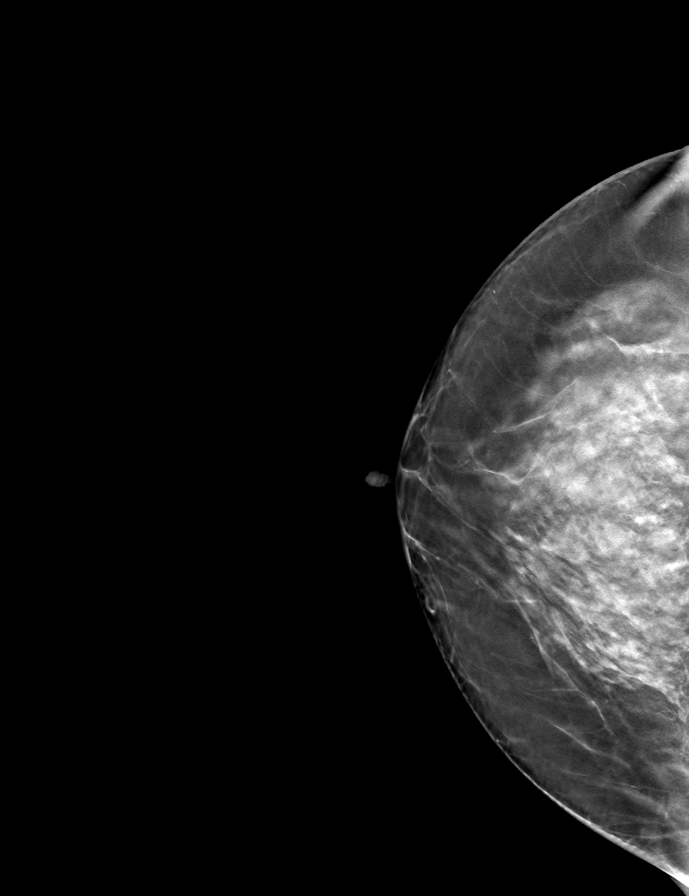

[R MLO tomo · tomo slice 35/68.0]
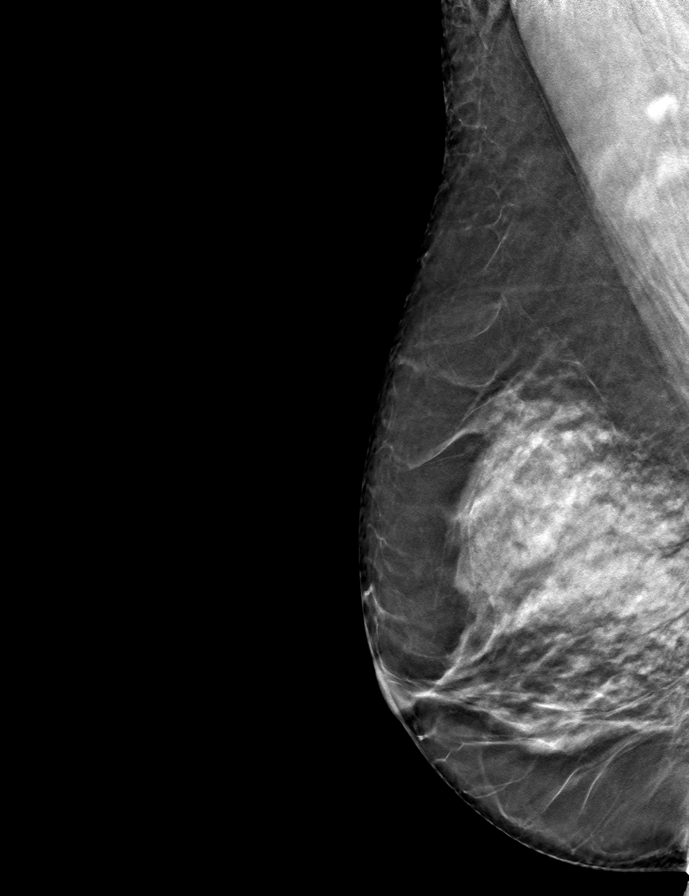

[9 of 24 positions shown; findings below may reference images not displayed]

ACR Breast Density Category c: The breast tissue is heterogeneously
dense, which may obscure small masses.
FINDINGS: There are no findings suspicious for malignancy. Images were
processed with CAD.
IMPRESSION: No mammographic evidence of malignancy. A result letter of this
screening mammogram will be mailed directly to the patient.

RECOMMENDATION:
Screening mammogram in one year. (Code:[id number])

BI-RADS CATEGORY  1: Negative.

## 2020-05-23 ENCOUNTER — Other Ambulatory Visit: Payer: Self-pay

## 2020-05-24 ENCOUNTER — Ambulatory Visit (INDEPENDENT_AMBULATORY_CARE_PROVIDER_SITE_OTHER): Payer: BC Managed Care – PPO

## 2020-05-24 DIAGNOSIS — Z23 Encounter for immunization: Secondary | ICD-10-CM | POA: Diagnosis not present

## 2020-05-24 NOTE — Progress Notes (Signed)
Per orders of Dr. Fabian Sharp, injection of Shingles vaccine given by Sherrin Daisy. Patient tolerated injection well.  Pt held for 15 min to observe for any possible reactions to vaccine.

## 2020-05-24 NOTE — Patient Instructions (Signed)
There are no preventive care reminders to display for this patient.  Depression screen [facility] 2/9 01/31/2020 03/17/2018  Decreased Interest 0 0  Down, Depressed, Hopeless 0 0  PHQ - 2 Score 0 0

## 2020-08-23 ENCOUNTER — Other Ambulatory Visit: Payer: Self-pay

## 2020-08-23 ENCOUNTER — Ambulatory Visit (INDEPENDENT_AMBULATORY_CARE_PROVIDER_SITE_OTHER): Payer: BC Managed Care – PPO

## 2020-08-23 DIAGNOSIS — Z23 Encounter for immunization: Secondary | ICD-10-CM | POA: Diagnosis not present

## 2020-08-23 NOTE — Progress Notes (Signed)
Per orders of Dr. Fabian Sharp, injection of Shingrix vacccine given by Sherrin Daisy. Patient tolerated injection well.

## 2020-11-21 ENCOUNTER — Other Ambulatory Visit: Payer: Self-pay | Admitting: Internal Medicine

## 2020-11-21 DIAGNOSIS — Z1231 Encounter for screening mammogram for malignant neoplasm of breast: Secondary | ICD-10-CM

## 2020-12-31 ENCOUNTER — Ambulatory Visit: Payer: BC Managed Care – PPO | Admitting: Obstetrics and Gynecology

## 2021-01-02 ENCOUNTER — Ambulatory Visit
Admission: RE | Admit: 2021-01-02 | Discharge: 2021-01-02 | Disposition: A | Discharge status: Home / Self Care | Payer: BC Managed Care – PPO | Source: Ambulatory Visit | Attending: Internal Medicine | Admitting: Internal Medicine

## 2021-01-02 ENCOUNTER — Other Ambulatory Visit: Payer: Self-pay

## 2021-01-02 DIAGNOSIS — Z1231 Encounter for screening mammogram for malignant neoplasm of breast: Secondary | ICD-10-CM | POA: Diagnosis not present

## 2021-01-08 ENCOUNTER — Encounter: Payer: Self-pay | Admitting: Internal Medicine

## 2021-01-08 NOTE — Progress Notes (Unsigned)
60 y.o. G0P0 Single White or Caucasian Not Hispanic or Latino female here for annual exam. No vaginal bleeding. Not sexually active.  No bowel or bladder c/o.      Patient's last menstrual period was 10/18/2014.          Sexually active: No.  The current method of family planning is post menopausal status.    Exercising: Yes.    walking, gym & core exercises Smoker:  no  Health Maintenance: Pap:   12-20-2018 neg HPV HR neg History of abnormal Pap:  yes MMG:  01-02-2021 category c density birads 1:neg BMD:   none Colonoscopy: 06-11-11 f/u 9yrs TDaP:  2014 Gardasil: n/a   reports that she has never smoked. She has never used smokeless tobacco. She reports previous alcohol use. She reports that she does not use drugs. 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  Past Medical History:  Diagnosis Date  . Abnormal Pap smear   . Chicken pox   . GERD (gastroesophageal reflux disease)   . HX: benign breast biopsy 2002    Past Surgical History:  Procedure Laterality Date  . BREAST EXCISIONAL BIOPSY Left 08/30/2001  . BREAST SURGERY  2001   benign tumor removed left breast  . CERVICAL BIOPSY  W/ LOOP ELECTRODE EXCISION  06/2001   CIN III  . COLPOSCOPY  07-14-2001   CIN III  . COLPOSCOPY  1996   dysplasia--colposcopy normal  . LASIK  2002  . WISDOM TOOTH EXTRACTION    . WISDOM TOOTH EXTRACTION  04/2017    No current outpatient medications on file.   No current facility-administered medications for this visit.    Family History  Problem Relation Age of Onset  . Osteoarthritis Mother   . Stroke Father 51  . Arthritis Father   . Breast cancer Maternal Aunt        in 70's    Review of Systems  Constitutional: Negative.   HENT: Negative.   Eyes: Negative.   Respiratory: Negative.   Cardiovascular: Negative.   Gastrointestinal: Negative.   Endocrine: Negative.   Genitourinary: Negative.   Musculoskeletal: Negative.   Skin: Negative.    Allergic/Immunologic: Negative.   Neurological: Negative.   Hematological: Negative.   Psychiatric/Behavioral: Negative.     Exam:   BP 114/66   Pulse 68   Resp 16   Ht 5' 7.25" (1.708 m)   Wt 164 lb (74.4 kg)   LMP 10/18/2014   BMI 25.50 kg/m   Weight change: @WEIGHTCHANGE @ Height:   Height: 5' 7.25" (170.8 cm)  Ht Readings from Last 3 Encounters:  01/11/21 5' 7.25" (1.708 m)  01/31/20 5' 7.32" (1.71 m)  12/28/19 5\' 7"  (1.702 m)    General appearance: alert, cooperative and appears stated age Head: Normocephalic, without obvious abnormality, atraumatic Neck: no adenopathy, supple, symmetrical, trachea midline and thyroid normal to inspection and palpation Lungs: clear to auscultation bilaterally Cardiovascular: regular rate and rhythm Breasts: normal appearance, no masses or tenderness Abdomen: soft, non-tender; non distended,  no masses,  no organomegaly Extremities: extremities normal, atraumatic, no cyanosis or edema Skin: Skin color, texture, turgor normal. No rashes or lesions Lymph nodes: Cervical, supraclavicular, and axillary nodes normal. No abnormal inguinal nodes palpated Neurologic: Grossly normal   Pelvic: External genitalia:  no lesions              Urethra:  normal appearing urethra with no masses, tenderness or lesions  Bartholins and Skenes: normal                 Vagina: normal appearing vagina with normal color and discharge, no lesions              Cervix: no lesions               Bimanual Exam:  Uterus:  normal size, contour, position, consistency, mobility, non-tender              Adnexa: no mass, fullness, tenderness               Rectovaginal: Confirms               Anus:  normal sphincter tone, no lesions  Cornelia Copa chaperoned for the exam.  1. Well woman exam with routine gynecological exam Pap next year Discussed breast self exam Discussed calcium and vit D intake Mammogram UTD Labs with primary Colon cancer screening  with primary.

## 2021-01-09 DIAGNOSIS — H25812 Combined forms of age-related cataract, left eye: Secondary | ICD-10-CM | POA: Diagnosis not present

## 2021-01-09 DIAGNOSIS — H04123 Dry eye syndrome of bilateral lacrimal glands: Secondary | ICD-10-CM | POA: Diagnosis not present

## 2021-01-09 DIAGNOSIS — H43813 Vitreous degeneration, bilateral: Secondary | ICD-10-CM | POA: Diagnosis not present

## 2021-01-11 ENCOUNTER — Encounter: Payer: Self-pay | Admitting: Obstetrics and Gynecology

## 2021-01-11 ENCOUNTER — Other Ambulatory Visit: Payer: Self-pay

## 2021-01-11 ENCOUNTER — Ambulatory Visit: Payer: BC Managed Care – PPO | Admitting: Obstetrics and Gynecology

## 2021-01-11 VITALS — BP 114/66 | HR 68 | Resp 16 | Ht 67.25 in | Wt 164.0 lb

## 2021-01-11 DIAGNOSIS — K219 Gastro-esophageal reflux disease without esophagitis: Secondary | ICD-10-CM | POA: Insufficient documentation

## 2021-01-11 DIAGNOSIS — Z01419 Encounter for gynecological examination (general) (routine) without abnormal findings: Secondary | ICD-10-CM

## 2021-01-11 NOTE — Patient Instructions (Signed)

## 2021-02-01 ENCOUNTER — Encounter: Payer: BC Managed Care – PPO | Admitting: Internal Medicine

## 2021-02-04 ENCOUNTER — Telehealth: Payer: Self-pay | Admitting: Internal Medicine

## 2021-02-04 DIAGNOSIS — Z0000 Encounter for general adult medical examination without abnormal findings: Secondary | ICD-10-CM

## 2021-02-04 DIAGNOSIS — H2512 Age-related nuclear cataract, left eye: Secondary | ICD-10-CM | POA: Diagnosis not present

## 2021-02-04 NOTE — Telephone Encounter (Signed)
Okay to schedule lab appt, thanks!

## 2021-02-04 NOTE — Telephone Encounter (Signed)
Patient is calling and is scheduled for a cpe on 2/23 and wanted to see if she can come in and get labs prior to appointment, please advise. CB is [phone]

## 2021-02-05 ENCOUNTER — Other Ambulatory Visit (INDEPENDENT_AMBULATORY_CARE_PROVIDER_SITE_OTHER): Payer: BC Managed Care – PPO

## 2021-02-05 ENCOUNTER — Other Ambulatory Visit: Payer: Self-pay

## 2021-02-05 DIAGNOSIS — Z0000 Encounter for general adult medical examination without abnormal findings: Secondary | ICD-10-CM

## 2021-02-05 LAB — LIPID PANEL
Cholesterol: 203 mg/dL — ABNORMAL HIGH (ref 0–200)
HDL: 73.2 mg/dL (ref >39.00)
LDL Cholesterol: 112 mg/dL — ABNORMAL HIGH (ref 0–99)
NonHDL: 129.71
Total CHOL/HDL Ratio: 3
Triglycerides: 87 mg/dL (ref 0.0–149.0)
VLDL: 17.4 mg/dL (ref 0.0–40.0)

## 2021-02-05 LAB — CBC WITH DIFFERENTIAL/PLATELET
Basophils Absolute: 0 10*3/uL (ref 0.0–0.1)
Basophils Relative: 0.5 % (ref 0.0–3.0)
Eosinophils Absolute: 0.1 10*3/uL (ref 0.0–0.7)
Eosinophils Relative: 1.3 % (ref 0.0–5.0)
HCT: 42.1 % (ref 36.0–46.0)
Hemoglobin: 14 g/dL (ref 12.0–15.0)
Lymphocytes Relative: 36.3 % (ref 12.0–46.0)
Lymphs Abs: 2.3 10*3/uL (ref 0.7–4.0)
MCHC: 33.3 g/dL (ref 30.0–36.0)
MCV: 89.7 fl (ref 78.0–100.0)
Monocytes Absolute: 0.5 10*3/uL (ref 0.1–1.0)
Monocytes Relative: 8.1 % (ref 3.0–12.0)
Neutro Abs: 3.5 10*3/uL (ref 1.4–7.7)
Neutrophils Relative %: 53.8 % (ref 43.0–77.0)
Platelets: 208 10*3/uL (ref 150.0–400.0)
RBC: 4.69 Mil/uL (ref 3.87–5.11)
RDW: 13.3 % (ref 11.5–15.5)
WBC: 6.5 10*3/uL (ref 4.0–10.5)

## 2021-02-05 LAB — HEPATIC FUNCTION PANEL
ALT: 14 U/L (ref 0–35)
AST: 18 U/L (ref 0–37)
Albumin: 4.3 g/dL (ref 3.5–5.2)
Alkaline Phosphatase: 68 U/L (ref 39–117)
Bilirubin, Direct: 0.1 mg/dL (ref 0.0–0.3)
Total Bilirubin: 0.9 mg/dL (ref 0.2–1.2)
Total Protein: 7.2 g/dL (ref 6.0–8.3)

## 2021-02-05 LAB — BASIC METABOLIC PANEL
BUN: 19 mg/dL (ref 6–23)
CO2: 28 mEq/L (ref 19–32)
Calcium: 9.5 mg/dL (ref 8.4–10.5)
Chloride: 102 mEq/L (ref 96–112)
Creatinine, Ser: 0.94 mg/dL (ref 0.40–1.20)
GFR: 65.78 mL/min (ref >60.00)
Glucose, Bld: 103 mg/dL — ABNORMAL HIGH (ref 70–99)
Potassium: 4.2 mEq/L (ref 3.5–5.1)
Sodium: 140 mEq/L (ref 135–145)

## 2021-02-05 LAB — TSH: TSH: 1.57 u[IU]/mL (ref 0.35–4.50)

## 2021-02-05 NOTE — Telephone Encounter (Signed)
Patient scheduled for labs today.

## 2021-02-05 NOTE — Progress Notes (Signed)
Chief Complaint  Patient presents with  . Annual Exam    HPI: Patient  Deborah Lang  60 y.o. comes in today for Preventive Health Care visit generally well no new changes in health.  Sees GYN asked to have a Cologuard screen. Updated she has had COVID immunizations and a booster last one in December.  No history of Covid infection She had a flu shot at her work this year.   Health Maintenance  Topic Date Due  . COVID-19 Vaccine (3 - Booster for Moderna series) 10/12/2020  . COLONOSCOPY (Pts 45-38yrs Insurance coverage will need to be confirmed)  06/10/2021  . PAP SMEAR-Modifier  12/20/2021  . MAMMOGRAM  01/02/2023  . TETANUS/TDAP  10/22/2023  . INFLUENZA VACCINE  Completed  . Hepatitis C Screening  Completed  . HIV Screening  Completed   Health Maintenance Review LIFESTYLE:  Exercise:  Good  2 miles 3-4 per week spin class and yoga as  Possible  Tobacco/ETS: Alcohol:  Rare monthly   Sugar beverages: Sleep:  6-7 hours.  Drug use: no HH of  1 cat  Work:   40 hours remote.   Not as busy  for and vit   ROS:  GEN/ HEENT: No fever, significant weight changes sweats headaches vision problems hearing changes, CV/ PULM; No chest pain shortness of breath cough, syncope,edema  change in exercise tolerance. GI /GU: No adominal pain, vomiting, change in bowel habits. No blood in the stool. No significant GU symptoms. SKIN/HEME: ,no acute skin rashes suspicious lesions or bleeding. No lymphadenopathy, nodules, masses.  NEURO/ PSYCH:  No neurologic signs such as weakness numbness. No depression anxiety. IMM/ Allergy: No unusual infections.  Allergy .   REST of 12 system review negative except as per HPI   Past Medical History:  Diagnosis Date  . Abnormal Pap smear   . Chicken pox   . GERD (gastroesophageal reflux disease)   . HX: benign breast biopsy 2002    Past Surgical History:  Procedure Laterality Date  . BREAST EXCISIONAL BIOPSY Left 08/30/2001  . BREAST SURGERY   2001   benign tumor removed left breast  . CERVICAL BIOPSY  W/ LOOP ELECTRODE EXCISION  06/2001   CIN III  . COLPOSCOPY  07-14-2001   CIN III  . COLPOSCOPY  1996   dysplasia--colposcopy normal  . LASIK  2002  . WISDOM TOOTH EXTRACTION    . WISDOM TOOTH EXTRACTION  05/17/17  Father died of CVA at age 66 and his cousin negative colon cancer Negative family history of first-degree relatives with diabetes  Family History  Problem Relation Age of Onset  . Osteoarthritis Mother   . Stroke Father 35  . Arthritis Father   . Breast cancer Maternal Aunt        in 3's    Social History   Socioeconomic History  . Marital status: Single    Spouse name: Not on file  . Number of children: Not on file  . Years of education: Not on file  . Highest education level: Not on file  Occupational History  . Not on file  Tobacco Use  . Smoking status: Never Smoker  . Smokeless tobacco: Never Used  Vaping Use  . Vaping Use: Never used  Substance and Sexual Activity  . Alcohol use: Not Currently  . Drug use: No  . Sexual activity: Not Currently    Partners: Male    Birth control/protection: Post-menopausal  Other Topics Concern  . 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   Masters level educ   G0P0   orig from [employer] Strain: Not on [employer] Insecurity: Not on file  Transportation Needs: Not on file  Physical Activity: Not on file  Stress: Not on file  Social Connections: Not on file    No outpatient medications prior to visit.   No facility-administered medications prior to visit.     EXAM:  BP 114/60   Pulse 64   Temp 98.7 F (37.1 C)   Resp 19   Ht  (1.702 m)   Wt 166 lb 3.2 oz (75.4 kg)   LMP 10/18/2014   SpO2 98%   BMI  26.03 kg/m   Body mass index is 26.03 kg/m. Wt Readings from Last 3 Encounters:  02/06/21 166 lb 3.2 oz (75.4 kg)  01/11/21 164 lb (74.4 kg)  01/31/20 173 lb 4 oz (78.6 kg)    Physical Exam: Vital signs reviewed WUJ:WJXB is a well-developed well-nourished alert cooperative    who appearsr stated age in no acute distress.  HEENT: normocephalic atraumatic , Eyes: PERRL EOM's full, conjunctiva clear, Nares: paten,t no deformity discharge or tenderness., Ears: no deformity EAC's clear TMs with normal landmarks. Mouth: masked  NECK: supple without masses, thyromegaly or bruits. CHEST/PULM:  Clear to auscultation and percussion breath sounds equal no wheeze , rales or rhonchi. No chest wall deformities or tenderness. Breast: normal by inspection . No dimpling, discharge, masses, tenderness or discharge . CV: PMI is nondisplaced, S1 S2 no gallops, murmurs, rubs. Peripheral pulses are full without delay.No JVD .  ABDOMEN: Bowel sounds normal nontender  No guard or rebound, no hepato splenomegal no CVA tenderness.   Extremtities:  No clubbing cyanosis or edema, no acute joint swelling or redness no focal atrophy NEURO:  Oriented x3, cranial nerves 3-12 appear to be intact, no obvious focal weakness,gait within normal limits no abnormal reflexes or asymmetrical SKIN: No acute rashes normal turgor, color, no bruising or petechiae. PSYCH: Oriented, good eye contact, no obvious depression anxiety, cognition and judgment appear normal. LN: no cervical axillary inguinal adenopathy  Lab Results  Component Value Date   WBC 6.5 02/05/2021   HGB 14.0 02/05/2021   HCT 42.1 02/05/2021   PLT 208.0 02/05/2021   GLUCOSE 103 (H) 02/05/2021   CHOL 203 (H) 02/05/2021   TRIG 87.0 02/05/2021   HDL 73.20 02/05/2021   LDLCALC 112 (H) 02/05/2021   ALT 14 02/05/2021   AST 18 02/05/2021   NA 140 02/05/2021   K 4.2 02/05/2021   CL 102 02/05/2021   CREATININE 0.94 02/05/2021   BUN 19 02/05/2021   CO2 28  02/05/2021   TSH 1.57 02/05/2021    BP Readings from Last 3 Encounters:  02/06/21 114/60  01/11/21 114/66  01/31/20 110/70    Lab results reviewed with patient   ASSESSMENT AND PLAN:  Discussed the following assessment and plan:    ICD-10-CM   1. Visit for preventive health examination  Z00.00   Borderline blood sugar fasting she will alter her diet continue physical exercise healthy lifestyle We will follow cholesterol blood sugar over time  and yearly.  She is not diabetic .  No other risk. Cologuard to be ordered Return in about 1 year (around 02/06/2022) for cpx with labs.  Patient Care Team: Hoyt Leanos, Neta MendsWanda K, MD as PCP - General (Internal Medicine) Patton SallesAmundson C Silva, Brook E, MD as Consulting Physician (Obstetrics and Gynecology) Nelson Chimesigby, Donald, MD as Consulting Physician (Ophthalmology) Charna ElizabethMann, Jyothi, MD as Consulting Physician (Gastroenterology) Patient Instructions   Continue lifestyle intervention healthy eating and exercise .  Avoid added sugars   .  Plan yearly  Labs and evaluation .   Will plan order for cologuard.    Health Maintenance, Female Adopting a healthy lifestyle and getting preventive care are important in promoting health and wellness. Ask your health care provider about:  The right schedule for you to have regular tests and exams.  Things you can do on your own to prevent diseases and keep yourself healthy. What should I know about diet, weight, and exercise? Eat a healthy diet  Eat a diet that includes plenty of vegetables, fruits, low-fat dairy products, and lean protein.  Do not eat a lot of foods that are high in solid fats, added sugars, or sodium.   Maintain a healthy weight Body mass index (BMI) is used to identify weight problems. It estimates body fat based on height and weight. Your health care provider can help determine your BMI and help you achieve or maintain a healthy weight. Get regular exercise Get regular exercise. This is one of  the most important things you can do for your health. Most adults should:  Exercise for at least 150 minutes each week. The exercise should increase your heart rate and make you sweat (moderate-intensity exercise).  Do strengthening exercises at least twice a week. This is in addition to the moderate-intensity exercise.  Spend less time sitting. Even light physical activity can be beneficial. Watch cholesterol and blood lipids Have your blood tested for lipids and cholesterol at 61 years of age, then have this test every 5 years. Have your cholesterol levels checked more often if:  Your lipid or cholesterol levels are high.  You are older than 61 years of age.  You are at high risk for heart disease. What should I know about cancer screening? Depending on your health history and family history, you may need to have cancer screening at various ages. This may include screening for:  Breast cancer.  Cervical cancer.  Colorectal cancer.  Skin cancer.  Lung cancer. What should I know about heart disease, diabetes, and high blood pressure? Blood pressure and heart disease  High blood pressure causes heart disease and increases the risk of stroke. This is more likely to develop in people who have high blood pressure readings, are of African descent, or are overweight.  Have your blood pressure checked: ? Every 3-5 years if you are 518-61 years of age. ? Every year if you are 61 years old or older. Diabetes Have regular diabetes screenings. This checks your fasting blood sugar level. Have the screening done:  Once every three years after age 61 if you are at a normal weight and have a low risk for diabetes.  More often and at a younger age if you are overweight or have a high risk for diabetes. What should I know about preventing infection? Hepatitis B If you have a higher risk for hepatitis B, you should be screened for this virus. Talk with your health care provider to find out  if you are at risk  for hepatitis B infection. Hepatitis C Testing is recommended for:  Everyone born from [birth year] through 1966.  Anyone with known risk factors for hepatitis C. Sexually transmitted infections (STIs)  Get screened for STIs, including gonorrhea and chlamydia, if: ? You are sexually active and are younger than 61 years of age. ? You are older than 61 years of age and your health care provider tells you that you are at risk for this type of infection. ? Your sexual activity has changed since you were last screened, and you are at increased risk for chlamydia or gonorrhea. Ask your health care provider if you are at risk.  Ask your health care provider about whether you are at high risk for HIV. Your health care provider may recommend a prescription medicine to help prevent HIV infection. If you choose to take medicine to prevent HIV, you should first get tested for HIV. You should then be tested every 3 months for as long as you are taking the medicine. Pregnancy  If you are about to stop having your period (premenopausal) and you may become pregnant, seek counseling before you get pregnant.  Take 400 to 800 micrograms (mcg) of folic acid every day if you become pregnant.  Ask for birth control (contraception) if you want to prevent pregnancy. Osteoporosis and menopause Osteoporosis is a disease in which the bones lose minerals and strength with aging. This can result in bone fractures. If you are 66 years old or older, or if you are at risk for osteoporosis and fractures, ask your health care provider if you should:  Be screened for bone loss.  Take a calcium or vitamin D supplement to lower your risk of fractures.  Be given hormone replacement therapy (HRT) to treat symptoms of menopause. Follow these instructions at home: Lifestyle  Do not use any products that contain nicotine or tobacco, such as cigarettes, e-cigarettes, and chewing tobacco. If you need help quitting,  ask your health care provider.  Do not use street drugs.  Do not share needles.  Ask your health care provider for help if you need support or information about quitting drugs. Alcohol use  Do not drink alcohol if: ? Your health care provider tells you not to drink. ? You are pregnant, may be pregnant, or are planning to become pregnant.  If you drink alcohol: ? Limit how much you use to 0-1 drink a day. ? Limit intake if you are breastfeeding.  Be aware of how much alcohol is in your drink. In the [place] one drink equals one 12 oz bottle of beer (355 mL), one 5 oz glass of wine (148 mL), or one 1 oz glass of hard liquor (44 mL). General instructions  Schedule regular health, dental, and eye exams.  Stay current with your vaccines.  Tell your health care provider if: ? You often feel depressed. ? You have ever been abused or do not feel safe at home. Summary  Adopting a healthy lifestyle and getting preventive care are important in promoting health and wellness.  Follow your health care provider's instructions about healthy diet, exercising, and getting tested or screened for diseases.  Follow your health care provider's instructions on monitoring your cholesterol and blood pressure. This information is not intended to replace advice given to you by your health care provider. Make sure you discuss any questions you have with your health care provider. Document Revised: 11/24/2018 Document Reviewed: 11/24/2018 Elsevier Patient Education  2021 Elsevier Inc.    Preventing  Type 2 Diabetes Mellitus Type 2 diabetes, also called type 2 diabetes mellitus, is a long-term (chronic) disease that affects sugar (glucose) levels in your blood. Normally, a hormone called insulin allows glucose to enter cells in your body. The cells use glucose for energy. With type 2 diabetes, you will have one or both of these problems:  Your pancreas does not make enough insulin.  Cells in your body  do not respond properly to insulin that your body makes (insulin resistance). Insulin resistance or lack of insulin causes extra glucose to build up in the blood instead of going into cells. As a result, high blood glucose (hyperglycemia) develops. That can cause many complications. Being overweight or obese and having an inactive (sedentary) lifestyle can increase your risk for diabetes. Type 2 diabetes can be delayed or prevented by making certain nutrition and lifestyle changes. How can this condition affect me? If you do not take steps to prevent diabetes, your blood glucose levels may keep increasing over time. Too much glucose in your blood for a long time can damage your blood vessels, heart, kidneys, nerves, and eyes. Type 2 diabetes can lead to chronic health problems and complications, such as:  Heart disease.  Stroke.  Blindness.  Kidney disease.  Depression.  Poor circulation in your feet and legs. In severe cases, a foot or leg may need to be surgically removed (amputated). What can increase my risk? You may be more likely to develop type 2 diabetes if you:  Have type 2 diabetes in your family.  Are overweight or obese.  Have a sedentary lifestyle.  Have insulin resistance or a history of prediabetes.  Have a history of pregnancy-related (gestational) diabetes or polycystic ovary syndrome (PCOS). What actions can I take to prevent this? It can be difficult to recognize signs of type 2 diabetes. Taking action to prevent the disease before you develop symptoms is the best way to avoid possible damage to your body. Making certain nutrition and lifestyle changes may prevent or delay the disease and related health problems. Nutrition  Eat healthy meals and snacks regularly. Do not skip meals. Fruit or a handful of nuts is a healthy snack between meals.  Drink water throughout the day. Avoid drinks that contain added sugar, such as soda or sweetened tea. Drink enough fluid  to keep your urine pale yellow.  Follow instructions from your health care provider about eating or drinking restrictions.  Limit the amount of food you eat by: ? Controlling how much you eat at a time (portion size). ? Checking food labels for the serving sizes of food. ? Using a kitchen scale to weigh amounts of food.  Saut or steam food instead of frying it. Cook with water or broth instead of oils or butter.  Limit saturated fat and salt (sodium) in your diet. Have no more than 1 tsp (2,400 mg) of sodium a day. If you have heart disease or high blood pressure, use less than ? tsp (1,500 mg) of sodium a day.   Lifestyle  Lose weight if needed and as told. Your health care provider can determine how much weight loss is best for you and can help you lose weight safely.  If you are overweight or obese, you may be told to lose at least 5?7% of your body weight.  Manage blood pressure, cholesterol, and stress. Your health care provider will help determine the best treatment for you.  Do not use any products that contain nicotine or tobacco,  such as cigarettes, e-cigarettes, and chewing tobacco. If you need help quitting, ask your health care provider.   Activity  Do physical activity that makes your heart beat faster and makes you sweat (moderate intensity). Do this for at least 30 minutes on at least 5 days of the week, or as much as told by your health care provider.  Ask your health care provider what activities are safe for you. A mix of activities may be best, such as walking, swimming, cycling, and strength training.  Try to add physical activity into your day. For example: ? Park your car farther away than usual so that you walk more. ? Take a walk during your lunch break. ? Use stairs instead of elevators or escalators. ? Walk or bike to work instead of driving.   Alcohol use If you drink alcohol:  Limit how much you use to: ? 0?1 drink a day for women who are not  pregnant. ? 0?2 drinks a day for men.  Be aware of how much alcohol is in your drink. In the [place] one drink equals one 12 oz bottle of beer (355 mL), one 5 oz glass of wine (148 mL), or one 1 oz glass of hard liquor (44 mL). General information  Talk with your health care provider about your risk factors and how you can reduce your risk for diabetes.  Have your blood glucose tested regularly, as told by your health care provider.  Get screening tests as told by your health care provider. You may have these regularly, especially if you have certain risk factors for type 2 diabetes.  Make an appointment with a registered dietitian. This diet and nutrition specialist can help you make a healthy eating plan and help you understand portion sizes and food labels. Where to find support  Ask your health care provider to recommend a registered dietitian, a certified diabetes care and education specialist, or a weight loss program.  Look for local or online weight loss groups.  Join a gym, fitness club, or outdoor activity group, such as a walking club. Where to find more information To learn more about diabetes and diabetes prevention, visit:  American Diabetes Association (ADA): www.diabetes.[employer] of Diabetes and Digestive and Kidney Diseases: CarFlippers.tn To learn more about healthy eating, visit:  [place] Department of Agriculture Architect): https://ball-collins.biz/  Office of Disease Prevention and Health Promotion (ODPHP): ResearchName.uy Summary  You can delay or prevent type 2 diabetes by eating healthy foods, losing weight if needed, and increasing your physical activity.  Talk with your health care provider about your risk factors for type 2 diabetes and how you can reduce your risk.  It can be difficult to recognize the signs of type 2 diabetes. The best way to avoid possible damage to your body is to take action to prevent the disease before you develop  symptoms.  Get screening tests as told by your health care provider. This information is not intended to replace advice given to you by your health care provider. Make sure you discuss any questions you have with your health care provider. Document Revised: 06/27/2020 Document Reviewed: 06/27/2020 Elsevier Patient Education  2021 [employer].    [place]. Jaliyah Fotheringham M.D.

## 2021-02-05 NOTE — Progress Notes (Signed)
Will review at upcoming visit

## 2021-02-06 ENCOUNTER — Ambulatory Visit (INDEPENDENT_AMBULATORY_CARE_PROVIDER_SITE_OTHER): Payer: BC Managed Care – PPO | Admitting: Internal Medicine

## 2021-02-06 ENCOUNTER — Encounter: Payer: Self-pay | Admitting: Internal Medicine

## 2021-02-06 VITALS — BP 114/60 | HR 64 | Temp 98.7°F | Resp 19 | Ht 67.0 in | Wt 166.2 lb

## 2021-02-06 DIAGNOSIS — Z0000 Encounter for general adult medical examination without abnormal findings: Secondary | ICD-10-CM | POA: Diagnosis not present

## 2021-02-06 NOTE — Patient Instructions (Signed)
Continue lifestyle intervention healthy eating and exercise .  Avoid added sugars   .  Plan yearly  Labs and evaluation .   Will plan order for cologuard.    Health Maintenance, Female Adopting a healthy lifestyle and getting preventive care are important in promoting health and wellness. Ask your health care provider about:  The right schedule for you to have regular tests and exams.  Things you can do on your own to prevent diseases and keep yourself healthy. What should I know about diet, weight, and exercise? Eat a healthy diet  Eat a diet that includes plenty of vegetables, fruits, low-fat dairy products, and lean protein.  Do not eat a lot of foods that are high in solid fats, added sugars, or sodium.   Maintain a healthy weight Body mass index (BMI) is used to identify weight problems. It estimates body fat based on height and weight. Your health care provider can help determine your BMI and help you achieve or maintain a healthy weight. Get regular exercise Get regular exercise. This is one of the most important things you can do for your health. Most adults should:  Exercise for at least 150 minutes each week. The exercise should increase your heart rate and make you sweat (moderate-intensity exercise).  Do strengthening exercises at least twice a week. This is in addition to the moderate-intensity exercise.  Spend less time sitting. Even light physical activity can be beneficial. Watch cholesterol and blood lipids Have your blood tested for lipids and cholesterol at 61 years of age, then have this test every 5 years. Have your cholesterol levels checked more often if:  Your lipid or cholesterol levels are high.  You are older than 61 years of age.  You are at high risk for heart disease. What should I know about cancer screening? Depending on your health history and family history, you may need to have cancer screening at various ages. This may include screening  for:  Breast cancer.  Cervical cancer.  Colorectal cancer.  Skin cancer.  Lung cancer. What should I know about heart disease, diabetes, and high blood pressure? Blood pressure and heart disease  High blood pressure causes heart disease and increases the risk of stroke. This is more likely to develop in people who have high blood pressure readings, are of African descent, or are overweight.  Have your blood pressure checked: ? Every 3-5 years if you are 89-25 years of age. ? Every year if you are 24 years old or older. Diabetes Have regular diabetes screenings. This checks your fasting blood sugar level. Have the screening done:  Once every three years after age 26 if you are at a normal weight and have a low risk for diabetes.  More often and at a younger age if you are overweight or have a high risk for diabetes. What should I know about preventing infection? Hepatitis B If you have a higher risk for hepatitis B, you should be screened for this virus. Talk with your health care provider to find out if you are at risk for hepatitis B infection. Hepatitis C Testing is recommended for:  Everyone born from [birth year] through 1966.  Anyone with known risk factors for hepatitis C. Sexually transmitted infections (STIs)  Get screened for STIs, including gonorrhea and chlamydia, if: ? You are sexually active and are younger than 61 years of age. ? You are older than 61 years of age and your health care provider tells you that you are  at risk for this type of infection. ? Your sexual activity has changed since you were last screened, and you are at increased risk for chlamydia or gonorrhea. Ask your health care provider if you are at risk.  Ask your health care provider about whether you are at high risk for HIV. Your health care provider may recommend a prescription medicine to help prevent HIV infection. If you choose to take medicine to prevent HIV, you should first get tested for HIV.  You should then be tested every 3 months for as long as you are taking the medicine. Pregnancy  If you are about to stop having your period (premenopausal) and you may become pregnant, seek counseling before you get pregnant.  Take 400 to 800 micrograms (mcg) of folic acid every day if you become pregnant.  Ask for birth control (contraception) if you want to prevent pregnancy. Osteoporosis and menopause Osteoporosis is a disease in which the bones lose minerals and strength with aging. This can result in bone fractures. If you are 15 years old or older, or if you are at risk for osteoporosis and fractures, ask your health care provider if you should:  Be screened for bone loss.  Take a calcium or vitamin D supplement to lower your risk of fractures.  Be given hormone replacement therapy (HRT) to treat symptoms of menopause. Follow these instructions at home: Lifestyle  Do not use any products that contain nicotine or tobacco, such as cigarettes, e-cigarettes, and chewing tobacco. If you need help quitting, ask your health care provider.  Do not use street drugs.  Do not share needles.  Ask your health care provider for help if you need support or information about quitting drugs. Alcohol use  Do not drink alcohol if: ? Your health care provider tells you not to drink. ? You are pregnant, may be pregnant, or are planning to become pregnant.  If you drink alcohol: ? Limit how much you use to 0-1 drink a day. ? Limit intake if you are breastfeeding.  Be aware of how much alcohol is in your drink. In the [place] one drink equals one 12 oz bottle of beer (355 mL), one 5 oz glass of wine (148 mL), or one 1 oz glass of hard liquor (44 mL). General instructions  Schedule regular health, dental, and eye exams.  Stay current with your vaccines.  Tell your health care provider if: ? You often feel depressed. ? You have ever been abused or do not feel safe at  home. Summary  Adopting a healthy lifestyle and getting preventive care are important in promoting health and wellness.  Follow your health care provider's instructions about healthy diet, exercising, and getting tested or screened for diseases.  Follow your health care provider's instructions on monitoring your cholesterol and blood pressure. This information is not intended to replace advice given to you by your health care provider. Make sure you discuss any questions you have with your health care provider. Document Revised: 11/24/2018 Document Reviewed: 11/24/2018 Elsevier Patient Education  2021 Elsevier Inc.    Preventing Type 2 Diabetes Mellitus Type 2 diabetes, also called type 2 diabetes mellitus, is a long-term (chronic) disease that affects sugar (glucose) levels in your blood. Normally, a hormone called insulin allows glucose to enter cells in your body. The cells use glucose for energy. With type 2 diabetes, you will have one or both of these problems:  Your pancreas does not make enough insulin.  Cells in your body  do not respond properly to insulin that your body makes (insulin resistance). Insulin resistance or lack of insulin causes extra glucose to build up in the blood instead of going into cells. As a result, high blood glucose (hyperglycemia) develops. That can cause many complications. Being overweight or obese and having an inactive (sedentary) lifestyle can increase your risk for diabetes. Type 2 diabetes can be delayed or prevented by making certain nutrition and lifestyle changes. How can this condition affect me? If you do not take steps to prevent diabetes, your blood glucose levels may keep increasing over time. Too much glucose in your blood for a long time can damage your blood vessels, heart, kidneys, nerves, and eyes. Type 2 diabetes can lead to chronic health problems and complications, such as:  Heart disease.  Stroke.  Blindness.  Kidney  disease.  Depression.  Poor circulation in your feet and legs. In severe cases, a foot or leg may need to be surgically removed (amputated). What can increase my risk? You may be more likely to develop type 2 diabetes if you:  Have type 2 diabetes in your family.  Are overweight or obese.  Have a sedentary lifestyle.  Have insulin resistance or a history of prediabetes.  Have a history of pregnancy-related (gestational) diabetes or polycystic ovary syndrome (PCOS). What actions can I take to prevent this? It can be difficult to recognize signs of type 2 diabetes. Taking action to prevent the disease before you develop symptoms is the best way to avoid possible damage to your body. Making certain nutrition and lifestyle changes may prevent or delay the disease and related health problems. Nutrition  Eat healthy meals and snacks regularly. Do not skip meals. Fruit or a handful of nuts is a healthy snack between meals.  Drink water throughout the day. Avoid drinks that contain added sugar, such as soda or sweetened tea. Drink enough fluid to keep your urine pale yellow.  Follow instructions from your health care provider about eating or drinking restrictions.  Limit the amount of food you eat by: ? Controlling how much you eat at a time (portion size). ? Checking food labels for the serving sizes of food. ? Using a kitchen scale to weigh amounts of food.  Saut or steam food instead of frying it. Cook with water or broth instead of oils or butter.  Limit saturated fat and salt (sodium) in your diet. Have no more than 1 tsp (2,400 mg) of sodium a day. If you have heart disease or high blood pressure, use less than ? tsp (1,500 mg) of sodium a day.   Lifestyle  Lose weight if needed and as told. Your health care provider can determine how much weight loss is best for you and can help you lose weight safely.  If you are overweight or obese, you may be told to lose at least 5?7% of  your body weight.  Manage blood pressure, cholesterol, and stress. Your health care provider will help determine the best treatment for you.  Do not use any products that contain nicotine or tobacco, such as cigarettes, e-cigarettes, and chewing tobacco. If you need help quitting, ask your health care provider.   Activity  Do physical activity that makes your heart beat faster and makes you sweat (moderate intensity). Do this for at least 30 minutes on at least 5 days of the week, or as much as told by your health care provider.  Ask your health care provider what activities are safe  for you. A mix of activities may be best, such as walking, swimming, cycling, and strength training.  Try to add physical activity into your day. For example: ? Park your car farther away than usual so that you walk more. ? Take a walk during your lunch break. ? Use stairs instead of elevators or escalators. ? Walk or bike to work instead of driving.   Alcohol use If you drink alcohol:  Limit how much you use to: ? 0?1 drink a day for women who are not pregnant. ? 0?2 drinks a day for men.  Be aware of how much alcohol is in your drink. In the [place] one drink equals one 12 oz bottle of beer (355 mL), one 5 oz glass of wine (148 mL), or one 1 oz glass of hard liquor (44 mL). General information  Talk with your health care provider about your risk factors and how you can reduce your risk for diabetes.  Have your blood glucose tested regularly, as told by your health care provider.  Get screening tests as told by your health care provider. You may have these regularly, especially if you have certain risk factors for type 2 diabetes.  Make an appointment with a registered dietitian. This diet and nutrition specialist can help you make a healthy eating plan and help you understand portion sizes and food labels. Where to find support  Ask your health care provider to recommend a registered dietitian, a  certified diabetes care and education specialist, or a weight loss program.  Look for local or online weight loss groups.  Join a gym, fitness club, or outdoor activity group, such as a walking club. Where to find more information To learn more about diabetes and diabetes prevention, visit:  American Diabetes Association (ADA): www.diabetes.[employer] of Diabetes and Digestive and Kidney Diseases: CarFlippers.tn To learn more about healthy eating, visit:  [place].S. Department of Agriculture Architect): https://ball-collins.biz/  Office of Disease Prevention and Health Promotion (ODPHP): ResearchName.uy Summary  You can delay or prevent type 2 diabetes by eating healthy foods, losing weight if needed, and increasing your physical activity.  Talk with your health care provider about your risk factors for type 2 diabetes and how you can reduce your risk.  It can be difficult to recognize the signs of type 2 diabetes. The best way to avoid possible damage to your body is to take action to prevent the disease before you develop symptoms.  Get screening tests as told by your health care provider. This information is not intended to replace advice given to you by your health care provider. Make sure you discuss any questions you have with your health care provider. Document Revised: 06/27/2020 Document Reviewed: 06/27/2020 Elsevier Patient Education  2021 [employer].

## 2021-02-26 DIAGNOSIS — H2512 Age-related nuclear cataract, left eye: Secondary | ICD-10-CM | POA: Diagnosis not present

## 2021-02-26 DIAGNOSIS — H25812 Combined forms of age-related cataract, left eye: Secondary | ICD-10-CM | POA: Diagnosis not present

## 2021-02-26 DIAGNOSIS — H268 Other specified cataract: Secondary | ICD-10-CM | POA: Diagnosis not present

## 2021-02-28 ENCOUNTER — Telehealth: Payer: Self-pay | Admitting: Internal Medicine

## 2021-02-28 ENCOUNTER — Other Ambulatory Visit: Payer: Self-pay

## 2021-02-28 DIAGNOSIS — Z1211 Encounter for screening for malignant neoplasm of colon: Secondary | ICD-10-CM

## 2021-02-28 NOTE — Telephone Encounter (Signed)
As requested by PCP's last office note on 02/06/21. Cologuard order was placed on 02/28/21.

## 2021-02-28 NOTE — Telephone Encounter (Signed)
Patient is calling and stated that at her last appointment they discussed her getting a Cologuard Kit and haven't heard back yet, please advise. CB is [phone]

## 2021-03-02 NOTE — Telephone Encounter (Signed)
Cologuard ordered on 02/28/2021.

## 2021-03-02 NOTE — Telephone Encounter (Signed)
Look into the answer to her question.

## 2021-04-17 DIAGNOSIS — Z1211 Encounter for screening for malignant neoplasm of colon: Secondary | ICD-10-CM | POA: Diagnosis not present

## 2021-04-18 LAB — COLOGUARD
Cologuard: NEGATIVE
Cologuard: NEGATIVE

## 2021-04-24 LAB — COLOGUARD
COLOGUARD: NEGATIVE
Cologuard: NEGATIVE

## 2021-04-24 LAB — EXTERNAL GENERIC LAB PROCEDURE: COLOGUARD: NEGATIVE

## 2021-04-25 ENCOUNTER — Telehealth: Payer: Self-pay

## 2021-04-25 ENCOUNTER — Encounter: Payer: Self-pay | Admitting: Internal Medicine

## 2021-04-25 NOTE — Telephone Encounter (Signed)
I attempted to contact patient in regards to cologuard results.

## 2021-05-02 NOTE — Telephone Encounter (Signed)
Patient informed of her cologuard results.

## 2021-05-26 NOTE — Telephone Encounter (Signed)
Please send this message to administrative team Not a clinical question. Have them answer patient . ( I believe we do take this insurance)

## 2021-05-28 NOTE — Telephone Encounter (Signed)
Noted  

## 2021-05-28 NOTE — Telephone Encounter (Signed)
Spoke with patient and made aware we do accept [facility] but to check with [facility] to make sure our office is in network. Patient understood.

## 2021-10-09 DIAGNOSIS — Z961 Presence of intraocular lens: Secondary | ICD-10-CM | POA: Diagnosis not present

## 2021-10-09 DIAGNOSIS — H43813 Vitreous degeneration, bilateral: Secondary | ICD-10-CM | POA: Diagnosis not present

## 2021-10-09 DIAGNOSIS — H04123 Dry eye syndrome of bilateral lacrimal glands: Secondary | ICD-10-CM | POA: Diagnosis not present

## 2021-12-03 ENCOUNTER — Other Ambulatory Visit: Payer: Self-pay | Admitting: Internal Medicine

## 2021-12-03 DIAGNOSIS — Z1231 Encounter for screening mammogram for malignant neoplasm of breast: Secondary | ICD-10-CM

## 2022-01-03 ENCOUNTER — Ambulatory Visit
Admission: RE | Admit: 2022-01-03 | Discharge: 2022-01-03 | Disposition: A | Discharge status: Home / Self Care | Payer: BC Managed Care – PPO | Source: Ambulatory Visit | Attending: Internal Medicine | Admitting: Internal Medicine

## 2022-01-03 DIAGNOSIS — Z1231 Encounter for screening mammogram for malignant neoplasm of breast: Secondary | ICD-10-CM

## 2022-01-07 NOTE — Progress Notes (Signed)
61 y.o. G0P0 Single White or Caucasian Not Hispanic or Latino female here for annual exam.  No vaginal bleeding.   No bowel c/o.  Mild GSI with valsalva and a full bladder. This has been happening intermittently for years. Leaks a small amount. Kegel's seem to help.     Patient's last menstrual period was 10/18/2014.          Sexually active: No.  The current method of family planning is post menopausal status.    Exercising: Yes.     Cardio, yoga, walking daily  Smoker:  no  Health Maintenance: Pap:  12-20-2018 neg HPV HR neg History of abnormal Pap:  yes, 06/2001 LEEP  For CIN III MMG:  01/03/22 density C Bi-rads 1 neg  BMD:   none  Colonoscopy: 06/11/11 04/24/21 cologuard negative  TDaP:  2014  Gardasil: n/a   reports that she has never smoked. She has never used smokeless tobacco. She reports that she does not currently use alcohol. She reports that she does not use drugs. Just occasional ETOH. She retired from [employer] in July.   Past Medical History:  Diagnosis Date   Abnormal Pap smear    Chicken pox    GERD (gastroesophageal reflux disease)    HX: benign breast biopsy 2002    Past Surgical History:  Procedure Laterality Date   BREAST EXCISIONAL BIOPSY Left 08/30/2001   BREAST SURGERY  2001   benign tumor removed left breast   CERVICAL BIOPSY  W/ LOOP ELECTRODE EXCISION  06/2001   CIN III   COLPOSCOPY  07-14-2001   CIN III   COLPOSCOPY  1996   dysplasia--colposcopy normal   LASIK  2002   WISDOM TOOTH EXTRACTION     WISDOM TOOTH EXTRACTION  04/2017    No current outpatient medications on file.   No current facility-administered medications for this visit.    Family History  Problem Relation Age of Onset   Osteoarthritis Mother    Stroke Father 61   Arthritis Father    Breast cancer Maternal Aunt        in 50's    Review of Systems  All other systems reviewed and are negative.  Exam:   BP 110/68    Pulse 64    Ht 5' 6.5" (1.689 m)    Wt 162 lb (73.5 kg)     LMP 10/18/2014    SpO2 99%    BMI 25.76 kg/m   Weight change: @WEIGHTCHANGE @ Height:   Height: 5' 6.5" (168.9 cm)  Ht Readings from Last 3 Encounters:  01/15/22 5' 6.5" (1.689 m)  02/06/21 5\' 7"  (1.702 m)  01/11/21 5' 7.25" (1.708 m)    General appearance: alert, cooperative and appears stated age Head: Normocephalic, without obvious abnormality, atraumatic Neck: no adenopathy, supple, symmetrical, trachea midline and thyroid normal to inspection and palpation Lungs: clear to auscultation bilaterally Cardiovascular: regular rate and rhythm Breasts: normal appearance, no masses or tenderness Abdomen: soft, non-tender; non distended,  no masses,  no organomegaly Extremities: extremities normal, atraumatic, no cyanosis or edema Skin: Skin color, texture, turgor normal. No rashes or lesions Lymph nodes: Cervical, supraclavicular, and axillary nodes normal. No abnormal inguinal nodes palpated Neurologic: Grossly normal   Pelvic: External genitalia:  no lesions              Urethra:  normal appearing urethra with no masses, tenderness or lesions              Bartholins and Skenes: normal  Vagina: normal appearing vagina with normal color and discharge, no lesions              Cervix: no lesions               Bimanual Exam:  Uterus:  normal size, contour, position, consistency, mobility, non-tender              Adnexa: no mass, fullness, tenderness               Rectovaginal: Confirms               Anus:  normal sphincter tone, no lesions  Gae Dry chaperoned for the exam.  1. Well woman exam Discussed breast self exam Discussed calcium and vit D intake Labs with primary Mammogram and cologuard UTD  2. Screening for cervical cancer - Cytology - PAP

## 2022-01-15 ENCOUNTER — Other Ambulatory Visit (HOSPITAL_COMMUNITY)
Admission: RE | Admit: 2022-01-15 | Discharge: 2022-01-15 | Disposition: A | Discharge status: Home / Self Care | Payer: BC Managed Care – PPO | Source: Ambulatory Visit | Attending: Obstetrics and Gynecology | Admitting: Obstetrics and Gynecology

## 2022-01-15 ENCOUNTER — Other Ambulatory Visit: Payer: Self-pay

## 2022-01-15 ENCOUNTER — Encounter: Payer: Self-pay | Admitting: Obstetrics and Gynecology

## 2022-01-15 ENCOUNTER — Ambulatory Visit (INDEPENDENT_AMBULATORY_CARE_PROVIDER_SITE_OTHER): Payer: BC Managed Care – PPO | Admitting: Obstetrics and Gynecology

## 2022-01-15 VITALS — BP 110/68 | HR 64 | Ht 66.5 in | Wt 162.0 lb

## 2022-01-15 DIAGNOSIS — Z01419 Encounter for gynecological examination (general) (routine) without abnormal findings: Secondary | ICD-10-CM

## 2022-01-15 DIAGNOSIS — Z124 Encounter for screening for malignant neoplasm of cervix: Secondary | ICD-10-CM | POA: Insufficient documentation

## 2022-01-15 NOTE — Patient Instructions (Signed)

## 2022-01-17 LAB — CYTOLOGY - PAP
Comment: NEGATIVE
Diagnosis: NEGATIVE
High risk HPV: NEGATIVE

## 2022-02-10 ENCOUNTER — Ambulatory Visit (INDEPENDENT_AMBULATORY_CARE_PROVIDER_SITE_OTHER): Payer: BC Managed Care – PPO | Admitting: Internal Medicine

## 2022-02-10 ENCOUNTER — Encounter: Payer: Self-pay | Admitting: Internal Medicine

## 2022-02-10 VITALS — BP 106/60 | HR 75 | Temp 98.5°F | Ht 66.03 in | Wt 163.0 lb

## 2022-02-10 DIAGNOSIS — Z0000 Encounter for general adult medical examination without abnormal findings: Secondary | ICD-10-CM | POA: Diagnosis not present

## 2022-02-10 DIAGNOSIS — E7800 Pure hypercholesterolemia, unspecified: Secondary | ICD-10-CM

## 2022-02-10 LAB — HEPATIC FUNCTION PANEL
ALT: 17 U/L (ref 0–35)
AST: 20 U/L (ref 0–37)
Albumin: 4.3 g/dL (ref 3.5–5.2)
Alkaline Phosphatase: 59 U/L (ref 39–117)
Bilirubin, Direct: 0.1 mg/dL (ref 0.0–0.3)
Total Bilirubin: 0.8 mg/dL (ref 0.2–1.2)
Total Protein: 7.2 g/dL (ref 6.0–8.3)

## 2022-02-10 LAB — BASIC METABOLIC PANEL
BUN: 16 mg/dL (ref 6–23)
CO2: 30 mEq/L (ref 19–32)
Calcium: 9.3 mg/dL (ref 8.4–10.5)
Chloride: 105 mEq/L (ref 96–112)
Creatinine, Ser: 0.99 mg/dL (ref 0.40–1.20)
GFR: 61.37 mL/min (ref >60.00)
Glucose, Bld: 89 mg/dL (ref 70–99)
Potassium: 4.2 mEq/L (ref 3.5–5.1)
Sodium: 141 mEq/L (ref 135–145)

## 2022-02-10 LAB — LIPID PANEL
Cholesterol: 199 mg/dL (ref 0–200)
HDL: 68.3 mg/dL (ref >39.00)
LDL Cholesterol: 107 mg/dL — ABNORMAL HIGH (ref 0–99)
NonHDL: 130.73
Total CHOL/HDL Ratio: 3
Triglycerides: 118 mg/dL (ref 0.0–149.0)
VLDL: 23.6 mg/dL (ref 0.0–40.0)

## 2022-02-10 LAB — CBC WITH DIFFERENTIAL/PLATELET
Basophils Absolute: 0 10*3/uL (ref 0.0–0.1)
Basophils Relative: 0.4 % (ref 0.0–3.0)
Eosinophils Absolute: 0.1 10*3/uL (ref 0.0–0.7)
Eosinophils Relative: 1.6 % (ref 0.0–5.0)
HCT: 42.7 % (ref 36.0–46.0)
Hemoglobin: 14 g/dL (ref 12.0–15.0)
Lymphocytes Relative: 27.9 % (ref 12.0–46.0)
Lymphs Abs: 1.6 10*3/uL (ref 0.7–4.0)
MCHC: 32.7 g/dL (ref 30.0–36.0)
MCV: 89.9 fl (ref 78.0–100.0)
Monocytes Absolute: 0.4 10*3/uL (ref 0.1–1.0)
Monocytes Relative: 7.3 % (ref 3.0–12.0)
Neutro Abs: 3.6 10*3/uL (ref 1.4–7.7)
Neutrophils Relative %: 62.8 % (ref 43.0–77.0)
Platelets: 189 10*3/uL (ref 150.0–400.0)
RBC: 4.75 Mil/uL (ref 3.87–5.11)
RDW: 13.1 % (ref 11.5–15.5)
WBC: 5.8 10*3/uL (ref 4.0–10.5)

## 2022-02-10 LAB — HEMOGLOBIN A1C: Hgb A1c MFr Bld: 5.6 % (ref 4.6–6.5)

## 2022-02-10 NOTE — Progress Notes (Signed)
Chief Complaint  Patient presents with   Annual Exam    HPI: Patient  Deborah Lang  61 y.o. comes in today for Preventive Health Care visit  Feels pretty healthy no major concerns. Well woman exam 2 23 dr [place] 2022  Health Maintenance  Topic Date Due   COVID-19 Vaccine (3 - Booster for Moderna series) 06/07/2020   COLONOSCOPY (Pts 45-6yrs Insurance coverage will need to be confirmed)  06/10/2021   INFLUENZA VACCINE  07/15/2021   TETANUS/TDAP  10/22/2023   MAMMOGRAM  01/04/2024   PAP SMEAR-Modifier  01/15/2025   Hepatitis C Screening  Completed   HIV Screening  Completed   Zoster Vaccines- Shingrix  Completed   HPV VACCINES  Aged Out   Health Maintenance Review LIFESTYLE:  Exercise:   walk almost 2 miles  spin and yoga  at [employer].  Tobacco/ETS:n Alcohol:  rare  Sugar beverages:  soda rare  Sleep: 6-7  Drug use: no HH of 1 and cat.  Work:retired in July  .    ROS:  REST of 12 system review negative except as per HPI   Past Medical History:  Diagnosis Date   Abnormal Pap smear    Chicken pox    GERD (gastroesophageal reflux disease)    HX: benign breast biopsy 2002    Past Surgical History:  Procedure Laterality Date   BREAST EXCISIONAL BIOPSY Left 08/30/2001   BREAST SURGERY  2001   benign tumor removed left breast   CERVICAL BIOPSY  W/ LOOP ELECTRODE EXCISION  06/2001   CIN III   COLPOSCOPY  07-14-2001   CIN III   COLPOSCOPY  1996   dysplasia--colposcopy normal   LASIK  2002   WISDOM TOOTH EXTRACTION     WISDOM TOOTH EXTRACTION  04/2017   Mom in 90s  had bowel issues stable  Family History  Problem Relation Age of Onset   Osteoarthritis Mother    Stroke Father 74   Arthritis Father    Breast cancer Maternal Aunt        in 39's    Social History   Socioeconomic History   Marital status: Single    Spouse name: Not on file   Number of children: Not on file   Years of education: Not on file   Highest education level: Not on  file  Occupational History   Not on file  Tobacco Use   Smoking status: Never   Smokeless tobacco: Never  Vaping Use   Vaping Use: Never used  Substance and Sexual Activity   Alcohol use: Not Currently   Drug use: No   Sexual activity: Not Currently    Partners: Male    Birth control/protection: Post-menopausal  Other Topics Concern   Not on file  Social History Narrative   7 hours of sleep per night   Lives alone single   One house cat   No tobacco   ocass  etoh   gyme 2-3 x per week    Yoga    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   Masters level educ   G0P0   orig from [employer] Strain: Not on [employer] Insecurity: Not on file  Transportation Needs: Not on file  Physical Activity: Not on file  Stress: Not on file  Social Connections: Not on file    No outpatient medications prior to visit.   No facility-administered  medications prior to visit.     EXAM:  BP 106/60 (BP Location: Left Arm, Patient Position: Sitting, Cuff Size: Normal)    Pulse 75    Temp 98.5 F (36.9 C) (Oral)    Ht 5' 6.03" (1.677 m)    Wt 163 lb (73.9 kg)    LMP 10/18/2014    SpO2 98%    BMI 26.29 kg/m   Body mass index is 26.29 kg/m. Wt Readings from Last 3 Encounters:  02/10/22 163 lb (73.9 kg)  01/15/22 162 lb (73.5 kg)  02/06/21 166 lb 3.2 oz (75.4 kg)    Physical Exam: Vital signs reviewed AOZ:HYQM is a well-developed well-nourished alert cooperative    who appearsr stated age in no acute distress.  HEENT: normocephalic atraumatic , Eyes: PERRL EOM's full, conjunctiva clear, Nares: paten,t no deformity discharge or tenderness., Ears: no deformity EAC's clear TMs with normal landmarks. Mouth: cNECK: supple without masses, thyromegaly or bruits. CHEST/PULM:  Clear to auscultation and percussion breath sounds equal no wheeze , rales or rhonchi. No chest wall deformities or tenderness. CV: PMI is nondisplaced, S1  S2 no gallops, murmurs, rubs. Peripheral pulses are full without delay.No JVD .  ABDOMEN: Bowel sounds normal nontender  No guard or rebound, no hepato splenomegal no CVA tenderness.  Extremtities:  No clubbing cyanosis or edema, no acute joint swelling or redness no focal atrophy NEURO:  Oriented x3, cranial nerves 3-12 appear to be intact, no obvious focal weakness,gait within normal limits no abnormal reflexes or asymmetrical SKIN: No acute rashes normal turgor, color, no bruising or petechiae. PSYCH: Oriented, good eye contact, no obvious depression anxiety, cognition and judgment appear normal. LN: no cervical axillary inguinal adenopathy  Lab Results  Component Value Date   WBC 6.5 02/05/2021   HGB 14.0 02/05/2021   HCT 42.1 02/05/2021   PLT 208.0 02/05/2021   GLUCOSE 103 (H) 02/05/2021   CHOL 203 (H) 02/05/2021   TRIG 87.0 02/05/2021   HDL 73.20 02/05/2021   LDLCALC 112 (H) 02/05/2021   ALT 14 02/05/2021   AST 18 02/05/2021   NA 140 02/05/2021   K 4.2 02/05/2021   CL 102 02/05/2021   CREATININE 0.94 02/05/2021   BUN 19 02/05/2021   CO2 28 02/05/2021   TSH 1.57 02/05/2021    BP Readings from Last 3 Encounters:  02/10/22 106/60  01/15/22 110/68  02/06/21 114/60    Labplanreviewed with patient  fasting today   ASSESSMENT AND PLAN:  Discussed the following assessment and plan:    ICD-10-CM   1. Visit for preventive health examination  Z00.00 Basic metabolic panel    CBC with Differential/Platelet    Hepatic function panel    Lipid panel    TSH    Hemoglobin A1c    Hemoglobin A1c    TSH    Lipid panel    Hepatic function panel    CBC with Differential/Platelet    Basic metabolic panel    2. Elevated LDL cholesterol level  E78.00 Basic metabolic panel    CBC with Differential/Platelet    Hepatic function panel    Lipid panel    TSH    Hemoglobin A1c    Hemoglobin A1c    TSH    Lipid panel    Hepatic function panel    CBC with Differential/Platelet     Basic metabolic panel    Continue lifestyle intervention healthy eating and exercise .  Monitoring labs  immuniz reviewed   Return in about  1 year (around 02/10/2023) for  cpx .  Patient Care Team: Almeter Westhoff, Neta Mends, MD as PCP - General (Internal Medicine) Patton Salles, MD as Consulting Physician (Obstetrics and Gynecology) Nelson Chimes, MD as Consulting Physician (Ophthalmology) Charna Elizabeth, MD as Consulting Physician (Gastroenterology) Patient Instructions  Good to see  you today . Continue lifestyle intervention healthy eating and exercise .  Labs pending   Tdap due next year   Neta Mends. Rekha Hobbins M.D.

## 2022-02-10 NOTE — Patient Instructions (Signed)
Good to see  you today . Continue lifestyle intervention healthy eating and exercise .  Labs pending   Tdap due next year

## 2022-02-11 LAB — TSH: TSH: 1.45 u[IU]/mL (ref 0.35–5.50)

## 2022-02-11 NOTE — Progress Notes (Signed)
Blood results are normal blood sugar kidney liver  and    cholesterol still favorable    Continue lifestyle intervention healthy eating and exercise .   The 10-year ASCVD risk score (Arnett DK, et al., 2019) is: 4.5%   Values used to calculate the score:     Age: 61 years     Sex: Female     Is Non-Hispanic African American: No     Diabetic: No     Tobacco smoker: Yes     Systolic Blood Pressure: 106 mmHg     Is BP treated: No     HDL Cholesterol: 68.3 mg/dL     Total Cholesterol: 199 mg/dL

## 2022-10-13 DIAGNOSIS — Z961 Presence of intraocular lens: Secondary | ICD-10-CM | POA: Diagnosis not present

## 2022-10-13 DIAGNOSIS — H04123 Dry eye syndrome of bilateral lacrimal glands: Secondary | ICD-10-CM | POA: Diagnosis not present

## 2022-10-13 DIAGNOSIS — H43813 Vitreous degeneration, bilateral: Secondary | ICD-10-CM | POA: Diagnosis not present

## 2022-11-26 ENCOUNTER — Other Ambulatory Visit: Payer: Self-pay | Admitting: Internal Medicine

## 2022-11-26 DIAGNOSIS — Z1231 Encounter for screening mammogram for malignant neoplasm of breast: Secondary | ICD-10-CM

## 2023-01-12 ENCOUNTER — Telehealth: Payer: Self-pay | Admitting: Internal Medicine

## 2023-01-12 NOTE — Telephone Encounter (Signed)
Patient was reviewing lab results and saw that it was documented that she was a tobacco smoker and she is stating that she has never smoked. Would like that corrected, to state she is not a smoke. Pls refer to labs 02/10/22 hemoglobin A1c and other.

## 2023-01-19 ENCOUNTER — Ambulatory Visit: Payer: BC Managed Care – PPO | Admitting: Obstetrics and Gynecology

## 2023-01-20 ENCOUNTER — Ambulatory Visit
Admission: RE | Admit: 2023-01-20 | Discharge: 2023-01-20 | Disposition: A | Discharge status: Home / Self Care | Payer: 59 | Source: Ambulatory Visit | Attending: Internal Medicine | Admitting: Internal Medicine

## 2023-01-20 DIAGNOSIS — Z1231 Encounter for screening mammogram for malignant neoplasm of breast: Secondary | ICD-10-CM | POA: Diagnosis not present

## 2023-02-09 NOTE — Progress Notes (Signed)
62 y.o. G0P0 Single White or Caucasian Not Hispanic or Latino female here for annual exam.  No vaginal bleeding.  Mild, tolerable GSI. She does exercises and kegels.   No bowel c/o.     Patient's last menstrual period was 10/15/2014 (approximate).          Sexually active: No.  The current method of family planning is post menopausal status.    Exercising: Yes.     Yoga spin class walking  Smoker:  no  Health Maintenance: Pap:  01/15/22 WNL Hr HPV neg; 12-20-2018 neg HPV HR neg History of abnormal Pap:  yes, 06/2001 LEEP  For CIN III MMG:  01/22/23 density D Bi-rads 1 neg  BMD:   none  Colonoscopy: 06/11/11  04/24/21 cologuard negative  TDaP:  2014  Gardasil: n/a   reports that she has never smoked. She has never used smokeless tobacco. She reports that she does not currently use alcohol. She reports that she does not use drugs. Occasional ETOH. Retired.   Past Medical History:  Diagnosis Date   Abnormal Pap smear    Chicken pox    GERD (gastroesophageal reflux disease)    HX: benign breast biopsy 2002    Past Surgical History:  Procedure Laterality Date   BREAST EXCISIONAL BIOPSY Left 08/30/2001   BREAST SURGERY  2001   benign tumor removed left breast   CERVICAL BIOPSY  W/ LOOP ELECTRODE EXCISION  06/2001   CIN III   COLPOSCOPY  07-14-2001   CIN III   COLPOSCOPY  1996   dysplasia--colposcopy normal   LASIK  2002   WISDOM TOOTH EXTRACTION     WISDOM TOOTH EXTRACTION  04/2017    No current outpatient medications on file.   No current facility-administered medications for this visit.    Family History  Problem Relation Age of Onset   Osteoarthritis Mother    Stroke Father 51   Arthritis Father    Breast cancer Maternal Aunt        in 83's    Review of Systems  All other systems reviewed and are negative.   Exam:   BP 128/64   Pulse 66   Ht 5' 6.54" (1.69 m)   Wt 160 lb (72.6 kg)   LMP 10/15/2014 (Approximate)   SpO2 100%   BMI 25.41 kg/m   Weight change:  '@WEIGHTCHANGE'$ @ Height:   Height: 5' 6.54" (169 cm)  Ht Readings from Last 3 Encounters:  02/18/23 5' 6.54" (1.69 m)  02/10/22 5' 6.03" (1.677 m)  01/15/22 5' 6.5" (1.689 m)    General appearance: alert, cooperative and appears stated age Head: Normocephalic, without obvious abnormality, atraumatic Neck: no adenopathy, supple, symmetrical, trachea midline and thyroid normal to inspection and palpation Lungs: clear to auscultation bilaterally Cardiovascular: regular rate and rhythm Breasts: normal appearance, no masses or tenderness Abdomen: soft, non-tender; non distended,  no masses,  no organomegaly Extremities: extremities normal, atraumatic, no cyanosis or edema Skin: Skin color, texture, turgor normal. No rashes or lesions Lymph nodes: Cervical, supraclavicular, and axillary nodes normal. No abnormal inguinal nodes palpated Neurologic: Grossly normal   Pelvic: External genitalia:  no lesions              Urethra:  normal appearing urethra with no masses, tenderness or lesions              Bartholins and Skenes: normal                 Vagina: atrophic appearing  vagina with normal color and discharge, no lesions              Cervix: no lesions               Bimanual Exam:  Uterus:  normal size, contour, position, consistency, mobility, non-tender              Adnexa: no mass, fullness, tenderness               Rectovaginal: Confirms               Anus:  normal sphincter tone, no lesions  Gae Dry, CMA chaperoned for the exam.      1. Well woman exam Discussed breast self exam Discussed calcium and vit D intake Mammogram utd Colonoscopy utd Labs with primary No pap this year She will get her immunizations with Dr Regis Bill

## 2023-02-16 ENCOUNTER — Encounter: Payer: BC Managed Care – PPO | Admitting: Internal Medicine

## 2023-02-18 ENCOUNTER — Encounter: Payer: Self-pay | Admitting: Obstetrics and Gynecology

## 2023-02-18 ENCOUNTER — Ambulatory Visit (INDEPENDENT_AMBULATORY_CARE_PROVIDER_SITE_OTHER): Payer: 59 | Admitting: Obstetrics and Gynecology

## 2023-02-18 VITALS — BP 128/64 | HR 66 | Ht 66.54 in | Wt 160.0 lb

## 2023-02-18 DIAGNOSIS — Z01419 Encounter for gynecological examination (general) (routine) without abnormal findings: Secondary | ICD-10-CM | POA: Diagnosis not present

## 2023-02-18 NOTE — Patient Instructions (Signed)

## 2023-02-26 ENCOUNTER — Encounter: Payer: Self-pay | Admitting: Internal Medicine

## 2023-02-26 ENCOUNTER — Ambulatory Visit (INDEPENDENT_AMBULATORY_CARE_PROVIDER_SITE_OTHER): Payer: 59 | Admitting: Internal Medicine

## 2023-02-26 VITALS — BP 102/72 | HR 69 | Temp 98.1°F | Ht 67.25 in | Wt 160.2 lb

## 2023-02-26 DIAGNOSIS — Z79899 Other long term (current) drug therapy: Secondary | ICD-10-CM

## 2023-02-26 DIAGNOSIS — Z0000 Encounter for general adult medical examination without abnormal findings: Secondary | ICD-10-CM | POA: Diagnosis not present

## 2023-02-26 DIAGNOSIS — Z23 Encounter for immunization: Secondary | ICD-10-CM

## 2023-02-26 DIAGNOSIS — E7800 Pure hypercholesterolemia, unspecified: Secondary | ICD-10-CM | POA: Diagnosis not present

## 2023-02-26 LAB — COMPREHENSIVE METABOLIC PANEL
ALT: 15 U/L (ref 0–35)
AST: 22 U/L (ref 0–37)
Albumin: 4.3 g/dL (ref 3.5–5.2)
Alkaline Phosphatase: 67 U/L (ref 39–117)
BUN: 20 mg/dL (ref 6–23)
CO2: 27 mEq/L (ref 19–32)
Calcium: 9.5 mg/dL (ref 8.4–10.5)
Chloride: 104 mEq/L (ref 96–112)
Creatinine, Ser: 0.89 mg/dL (ref 0.40–1.20)
GFR: 69.23 mL/min (ref >60.00)
Glucose, Bld: 102 mg/dL — ABNORMAL HIGH (ref 70–99)
Potassium: 4 mEq/L (ref 3.5–5.1)
Sodium: 139 mEq/L (ref 135–145)
Total Bilirubin: 0.9 mg/dL (ref 0.2–1.2)
Total Protein: 7.6 g/dL (ref 6.0–8.3)

## 2023-02-26 LAB — CBC WITH DIFFERENTIAL/PLATELET
Basophils Absolute: 0 10*3/uL (ref 0.0–0.1)
Basophils Relative: 0.4 % (ref 0.0–3.0)
Eosinophils Absolute: 0.1 10*3/uL (ref 0.0–0.7)
Eosinophils Relative: 1.8 % (ref 0.0–5.0)
HCT: 43.4 % (ref 36.0–46.0)
Hemoglobin: 14.6 g/dL (ref 12.0–15.0)
Lymphocytes Relative: 31.3 % (ref 12.0–46.0)
Lymphs Abs: 2.1 10*3/uL (ref 0.7–4.0)
MCHC: 33.7 g/dL (ref 30.0–36.0)
MCV: 89.7 fl (ref 78.0–100.0)
Monocytes Absolute: 0.5 10*3/uL (ref 0.1–1.0)
Monocytes Relative: 7.9 % (ref 3.0–12.0)
Neutro Abs: 4 10*3/uL (ref 1.4–7.7)
Neutrophils Relative %: 58.6 % (ref 43.0–77.0)
Platelets: 227 10*3/uL (ref 150.0–400.0)
RBC: 4.83 Mil/uL (ref 3.87–5.11)
RDW: 13 % (ref 11.5–15.5)
WBC: 6.8 10*3/uL (ref 4.0–10.5)

## 2023-02-26 LAB — LIPID PANEL
Cholesterol: 208 mg/dL — ABNORMAL HIGH (ref 0–200)
HDL: 78.1 mg/dL (ref >39.00)
LDL Cholesterol: 110 mg/dL — ABNORMAL HIGH (ref 0–99)
NonHDL: 129.7
Total CHOL/HDL Ratio: 3
Triglycerides: 101 mg/dL (ref 0.0–149.0)
VLDL: 20.2 mg/dL (ref 0.0–40.0)

## 2023-02-26 LAB — TSH: TSH: 1.43 u[IU]/mL (ref 0.35–5.50)

## 2023-02-26 NOTE — Progress Notes (Signed)
Chief Complaint  Patient presents with   Annual Exam    HPI: Patient  Deborah Lang  62 y.o. comes in today for [facility] visit  No major changes in health  Health Maintenance  Topic Date Due   COVID-19 Vaccine (4 - 2023-24 season) 11/28/2022   COLONOSCOPY (Pts 45-1yrs Insurance coverage will need to be confirmed)  05/29/2023 (Originally 06/10/2021)   PAP SMEAR-Modifier  01/15/2025   MAMMOGRAM  01/20/2025   DTaP/Tdap/Td (3 - Td or Tdap) 02/25/2033   INFLUENZA VACCINE  Completed   Hepatitis C Screening  Completed   HIV Screening  Completed   Zoster Vaccines- Shingrix  Completed   HPV VACCINES  Aged Out   Health Maintenance Review LIFESTYLE:  Exercise:  spin yoga   Tobacco/ETS: Alcohol: min Sugar beverages:littl Sleep:  aim 7 6  Drug use: no HH of 1 2 cats 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    ROS:  GEN/ HEENT: No fever, significant weight changes sweats headaches vision problems hearing changes, CV/ PULM; No chest pain shortness of breath cough, syncope,edema  change in exercise tolerance. GI /GU: No adominal pain, vomiting, change in bowel habits. No blood in the stool. No significant GU symptoms. SKIN/HEME: ,no acute skin rashes suspicious lesions or bleeding. No lymphadenopathy, nodules, masses.  NEURO/ PSYCH:  No neurologic signs such as weakness numbness. No depression anxiety. IMM/ Allergy: No unusual infections.  Allergy .   REST of 12 system review negative except as per HPI   Past Medical History:  Diagnosis Date   Abnormal Pap smear    Chicken pox    GERD (gastroesophageal reflux disease)    HX: benign breast biopsy 2002    Past Surgical History:  Procedure Laterality Date   BREAST EXCISIONAL BIOPSY Left 08/30/2001   BREAST SURGERY  2001   benign tumor removed left breast   CERVICAL BIOPSY  W/ LOOP ELECTRODE EXCISION  06/2001   CIN III   COLPOSCOPY  07-14-2001   CIN III   COLPOSCOPY  1996   dysplasia--colposcopy normal   LASIK  2002    WISDOM TOOTH EXTRACTION     WISDOM TOOTH EXTRACTION  04/2017    Family History  Problem Relation Age of Onset   Osteoarthritis Mother    Stroke Father 37   Arthritis Father    Breast cancer Maternal Aunt        in 59's    Social History   Socioeconomic History   Marital status: Single    Spouse name: Not on file   Number of children: Not on file   Years of education: Not on file   Highest education level: Not on file  Occupational History   Not on file  Tobacco Use   Smoking status: Never   Smokeless tobacco: Never  Vaping Use   Vaping Use: Never used  Substance and Sexual Activity   Alcohol use: Not Currently   Drug use: No   Sexual activity: Not Currently    Partners: Male    Birth control/protection: Post-menopausal  Other Topics Concern   Not on file  Social History Narrative   7 hours of sleep per night   Lives alone single   One house cat   No tobacco   ocass  etoh   gyme 2-3 x per week    Yoga    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   Masters level educ   G0P0   orig from  [place]    Social Determinants of Health   Financial Resource Strain: Not on file  Food Insecurity: Not on file  Transportation Needs: Not on file  Physical Activity: Not on file  Stress: Not on file  Social Connections: Not on file    No outpatient medications prior to visit.   No facility-administered medications prior to visit.     EXAM:  BP 102/72 (BP Location: Right Arm, Patient Position: Sitting, Cuff Size: Normal)   Pulse 69   Temp 98.1 F (36.7 C) (Oral)   Ht 5' 7.25" (1.708 m)   Wt 160 lb 3.2 oz (72.7 kg)   LMP 10/15/2014 (Approximate)   SpO2 99%   BMI 24.90 kg/m   Body mass index is 24.9 kg/m. Wt Readings from Last 3 Encounters:  02/26/23 160 lb 3.2 oz (72.7 kg)  02/18/23 160 lb (72.6 kg)  02/10/22 163 lb (73.9 kg)    Physical Exam: Vital signs reviewed [id number] is a well-developed well-nourished alert cooperative    who  appearsr stated age in no acute distress.  HEENT: normocephalic atraumatic , Eyes: PERRL EOM's full, conjunctiva clear, Nares: paten,t no deformity discharge or tenderness., Ears: no deformity EAC's clear TMs with normal landmarks. Mouth: clear OP, no lesions, edema.  Moist mucous membranes. Dentition in adequate repair. NECK: supple without masses, thyromegaly or bruits. CHEST/PULM:  Clear to auscultation and percussion breath sounds equal no wheeze , rales or rhonchi. No chest wall deformities or tenderness. Breast: normal by inspection . No dimpling, discharge, masses, tenderness or discharge . CV: PMI is nondisplaced, S1 S2 no gallops, murmurs, rubs. Peripheral pulses are full without delay.No JVD .  ABDOMEN: Bowel sounds normal nontender  No guard or rebound, no hepato splenomegal no CVA tenderness.  Extremtities:  No clubbing cyanosis or edema, no acute joint swelling or redness no focal atrophy NEURO:  Oriented x3, cranial nerves 3-12 appear to be intact, no obvious focal weakness,gait within normal limits no abnormal reflexes or asymmetrical SKIN: No acute rashes normal turgor, color, no bruising or petechiae. PSYCH: Oriented, good eye contact, no obvious depression anxiety, cognition and judgment appear normal. LN: no cervical axillary inguinal adenopathy  Lab Results  Component Value Date   WBC 6.8 02/26/2023   HGB 14.6 02/26/2023   HCT 43.4 02/26/2023   PLT 227.0 02/26/2023   GLUCOSE 102 (H) 02/26/2023   CHOL 208 (H) 02/26/2023   TRIG 101.0 02/26/2023   HDL 78.10 02/26/2023   LDLCALC 110 (H) 02/26/2023   ALT 15 02/26/2023   AST 22 02/26/2023   NA 139 02/26/2023   K 4.0 02/26/2023   CL 104 02/26/2023   CREATININE 0.89 02/26/2023   BUN 20 02/26/2023   CO2 27 02/26/2023   TSH 1.43 02/26/2023   HGBA1C 5.6 02/10/2022    BP Readings from Last 3 Encounters:  02/26/23 102/72  02/18/23 128/64  02/10/22 106/60    Lab planeviewed with patient   ASSESSMENT AND PLAN:   r Discussed the following assessment and plan:    ICD-10-CM   1. Visit for preventive health examination  Z00.00 Comprehensive metabolic panel    CBC with Differential/Platelet    Lipid panel    TSH    2. Elevated LDL cholesterol level  E78.00 Comprehensive metabolic panel    CBC with Differential/Platelet    Lipid panel    TSH    3. Medication management  Z79.899 Comprehensive metabolic panel    CBC with Differential/Platelet    Lipid panel  TSH    4. Need for tetanus booster  Z23 Tdap vaccine greater than or equal to 7yo IM    Reviewed screening parameters  advice  See instructions  Tdap   Return in about 1 year (around 02/26/2024) for depending on results.  Patient Care Team: Leeza Heiner, Standley Brooking, MD as PCP - General (Internal Medicine) Nunzio Cobbs, MD as Consulting Physician (Obstetrics and Gynecology) Calvert Cantor, MD as Consulting Physician (Ophthalmology) Juanita Craver, MD as Consulting Physician (Gastroenterology) Patient Instructions  Good to see you today . Continue lifestyle intervention healthy eating and exercise .   Lab today  If all ok then yearly check .  Tdap today  Cologuard every 3 years . Acknowlege you are not a smoker.... and this is not on the problem list old or new   Standley Brooking. Claudetta Sallie M.D.

## 2023-02-26 NOTE — Patient Instructions (Signed)
Good to see you today . Continue lifestyle intervention healthy eating and exercise .   Lab today  If all ok then yearly check .  Tdap today  Cologuard every 3 years . Acknowlege you are not a smoker.... and this is not on the problem list old or new

## 2023-03-01 NOTE — Progress Notes (Signed)
No diabetes bprder;ine fastingblood sugar  Cholesterol panel:  .ldl slightly up from last year  but still favorable profile Continue lifestyle intervention healthy eating and exercise .

## 2023-12-10 ENCOUNTER — Other Ambulatory Visit: Payer: Self-pay | Admitting: Internal Medicine

## 2023-12-10 DIAGNOSIS — Z1231 Encounter for screening mammogram for malignant neoplasm of breast: Secondary | ICD-10-CM

## 2024-01-22 ENCOUNTER — Ambulatory Visit
Admission: RE | Admit: 2024-01-22 | Discharge: 2024-01-22 | Disposition: A | Discharge status: Home / Self Care | Payer: 59 | Source: Ambulatory Visit

## 2024-01-22 DIAGNOSIS — Z1231 Encounter for screening mammogram for malignant neoplasm of breast: Secondary | ICD-10-CM

## 2024-02-22 ENCOUNTER — Telehealth: Payer: Self-pay | Admitting: Internal Medicine

## 2024-02-22 DIAGNOSIS — Z0000 Encounter for general adult medical examination without abnormal findings: Secondary | ICD-10-CM

## 2024-02-22 DIAGNOSIS — R739 Hyperglycemia, unspecified: Secondary | ICD-10-CM

## 2024-02-22 DIAGNOSIS — E7800 Pure hypercholesterolemia, unspecified: Secondary | ICD-10-CM

## 2024-02-22 DIAGNOSIS — Z79899 Other long term (current) drug therapy: Secondary | ICD-10-CM

## 2024-02-22 NOTE — Telephone Encounter (Signed)
 Copied from CRM [id number]. Topic: Appointments - Scheduling Inquiry for Clinic >> Feb 22, 2024  2:20 PM Deborah Lang wrote: Reason for CRM: Patient called in wanting to get labs done before her physical appointment , is requesting for someone to call her back from lab appointment once they have been ordered

## 2024-02-23 ENCOUNTER — Encounter: Payer: Self-pay | Admitting: Obstetrics and Gynecology

## 2024-02-23 ENCOUNTER — Other Ambulatory Visit (HOSPITAL_COMMUNITY)
Admission: RE | Admit: 2024-02-23 | Discharge: 2024-02-23 | Disposition: A | Discharge status: Home / Self Care | Source: Ambulatory Visit | Attending: Obstetrics and Gynecology | Admitting: Obstetrics and Gynecology

## 2024-02-23 ENCOUNTER — Ambulatory Visit (INDEPENDENT_AMBULATORY_CARE_PROVIDER_SITE_OTHER): Payer: 59 | Admitting: Obstetrics and Gynecology

## 2024-02-23 VITALS — BP 110/74 | HR 80 | Ht 66.75 in | Wt 163.0 lb

## 2024-02-23 DIAGNOSIS — D229 Melanocytic nevi, unspecified: Secondary | ICD-10-CM

## 2024-02-23 DIAGNOSIS — E2839 Other primary ovarian failure: Secondary | ICD-10-CM

## 2024-02-23 DIAGNOSIS — Z1211 Encounter for screening for malignant neoplasm of colon: Secondary | ICD-10-CM

## 2024-02-23 DIAGNOSIS — Z01419 Encounter for gynecological examination (general) (routine) without abnormal findings: Secondary | ICD-10-CM

## 2024-02-23 DIAGNOSIS — Z1331 Encounter for screening for depression: Secondary | ICD-10-CM | POA: Diagnosis not present

## 2024-02-23 NOTE — Progress Notes (Signed)
 63 y.o. y.o. female here for annual exam. Patient's last menstrual period was 10/15/2014 (approximate).    Sexually active: No.  The current method of family planning is post menopausal status.    Exercising: Yes.    Yoga spin class walking  Smoker:  no  Health Maintenance: No PM bleeding. No HRT. Not on calcium or vit D.  Encouraged to start Pap:  01/15/22 WNL Hr HPV neg; 12-20-2018 neg HPV HR neg History of abnormal Pap:  yes, 06/2001 LEEP  For CIN III 20 years ago. Normal on repeats 02/23/24 collected pap smear. Can space out or stop at 64 MMG: 01/23/24 density D Bi-rads 1 neg  BMD:   mother with osteoporosis, no current fractures in her. >10 years in menopause.  Baseline ordered 02/23/24 Colonoscopy: 06/11/11  04/24/21 cologuard negative  TDaP:  2014  Gardasil: n/a   Body mass index is 25.72 kg/m.     02/23/2024    9:12 AM 02/26/2023    9:34 AM 02/10/2022   11:16 AM  Depression screen PHQ 2/9  Decreased Interest 0 0 0  Down, Depressed, Hopeless 0 0 0  PHQ - 2 Score 0 0 0    Blood pressure 110/74, pulse 80, height 5' 6.75" (1.695 m), weight 163 lb (73.9 kg), last menstrual period 10/15/2014, SpO2 97%.     Component Value Date/Time   DIAGPAP  01/15/2022 1502    - Negative for intraepithelial lesion or malignancy (NILM)   DIAGPAP  12/20/2018 0000    NEGATIVE FOR INTRAEPITHELIAL LESIONS OR MALIGNANCY.   HPVHIGH Negative 01/15/2022 1502   ADEQPAP  01/15/2022 1502    Satisfactory for evaluation; transformation zone component PRESENT.   ADEQPAP  12/20/2018 0000    Satisfactory for evaluation. The presence or absence of an endocervical / transformation zone component cannot be determined because of atrophy.    GYN HISTORY:    Component Value Date/Time   DIAGPAP  01/15/2022 1502    - Negative for intraepithelial lesion or malignancy (NILM)   DIAGPAP  12/20/2018 0000    NEGATIVE FOR INTRAEPITHELIAL LESIONS OR MALIGNANCY.   HPVHIGH Negative 01/15/2022 1502   ADEQPAP   01/15/2022 1502    Satisfactory for evaluation; transformation zone component PRESENT.   ADEQPAP  12/20/2018 0000    Satisfactory for evaluation. The presence or absence of an endocervical / transformation zone component cannot be determined because of atrophy.    OB History  Gravida Para Term Preterm AB Living  0       SAB IAB Ectopic Multiple Live Births          Past Medical History:  Diagnosis Date   Abnormal Pap smear    Chicken pox    GERD (gastroesophageal reflux disease)    HX: benign breast biopsy 2002    Past Surgical History:  Procedure Laterality Date   BREAST EXCISIONAL BIOPSY Left 08/30/2001   BREAST SURGERY  2001   benign tumor removed left breast   CERVICAL BIOPSY  W/ LOOP ELECTRODE EXCISION  06/2001   CIN III   COLPOSCOPY  07-14-2001   CIN III   COLPOSCOPY  1996   dysplasia--colposcopy normal   LASIK  2002   WISDOM TOOTH EXTRACTION     WISDOM TOOTH EXTRACTION  04/2017    Current Outpatient Medications on File Prior to Visit  Medication Sig Dispense Refill   Omega-3 Fatty Acids (FISH OIL PO) Take by mouth.     No current facility-administered medications on file prior to  visit.    Social History   Socioeconomic History   Marital status: Single    Spouse name: Not on file   Number of children: Not on file   Years of education: Not on file   Highest education level: Not on file  Occupational History   Not on file  Tobacco Use   Smoking status: Never   Smokeless tobacco: Never  Vaping Use   Vaping status: Never Used  Substance and Sexual Activity   Alcohol use: Yes    Comment: beer once a month   Drug use: No   Sexual activity: Not Currently    Partners: Male    Birth control/protection: Post-menopausal  Other Topics Concern   Not on file  Social History Narrative   7 hours of sleep per night   Lives alone single   One house cat   No tobacco   ocass  etoh   gyme 2-3 x per week    Yoga    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   Masters level educ   G0P0   orig from [place]    Social Drivers of Corporate investment banker Strain: Not on [employer] Insecurity: Not on file  Transportation Needs: Not on file  Physical Activity: Not on file  Stress: Not on file  Social Connections: Not on file  Intimate Partner Violence: Not on file    Family History  Problem Relation Age of Onset   Osteoarthritis Mother    Stroke Father 32   Arthritis Father    Breast cancer Maternal Aunt 66 - 79     No Known Allergies    Patient's last menstrual period was Patient's last menstrual period was 10/15/2014 (approximate)..           Review of Systems Alls systems reviewed and are negative.     Physical Exam Constitutional:      Appearance: Normal appearance.  Genitourinary:     Vulva and urethral meatus normal.     No lesions in the vagina.     Right Labia: No rash, lesions or skin changes.    Left Labia: No lesions, skin changes or rash.    No vaginal discharge or tenderness.     No vaginal prolapse present.    No vaginal atrophy present.     Right Adnexa: not tender, not palpable and no mass present.    Left Adnexa: not tender, not palpable and no mass present.    No cervical motion tenderness or discharge.     Uterus is not enlarged, tender or irregular.  Breasts:    Right: Normal.     Left: Normal.  HENT:     Head: Normocephalic.  Neck:     Thyroid: No thyroid mass, thyromegaly or thyroid tenderness.  Cardiovascular:     Rate and Rhythm: Normal rate and regular rhythm.     Heart sounds: Normal heart sounds, S1 normal and S2 normal.  Pulmonary:     Effort: Pulmonary effort is normal.     Breath sounds: Normal breath sounds and air entry.  Abdominal:     General: There is no distension.     Palpations: Abdomen is soft. There is no mass.     Tenderness: There is no abdominal tenderness. There is no guarding or rebound.  Musculoskeletal:        General: Normal range of motion.      Cervical back: Full passive range of motion  without pain, normal range of motion and neck supple. No tenderness.     Right lower leg: No edema.     Left lower leg: No edema.  Neurological:     Mental Status: She is alert.  Skin:    General: Skin is warm.  Psychiatric:        Mood and Affect: Mood normal.        Behavior: Behavior normal.        Thought Content: Thought content normal.  Vitals and nursing note reviewed. Exam conducted with a chaperone present.       A:         Well Woman GYN exam                             P:        Pap smear collected today Encouraged annual mammogram screening Colon cancer screening up-to-date DXA ordered today Labs and immunizations to do with PMD Encouraged healthy lifestyle practices Encouraged Vit D and Calcium   No follow-ups on file.  Earley Favor

## 2024-02-24 ENCOUNTER — Encounter: Payer: Self-pay | Admitting: Internal Medicine

## 2024-02-24 ENCOUNTER — Encounter: Payer: Self-pay | Admitting: Obstetrics and Gynecology

## 2024-02-25 LAB — CYTOLOGY - PAP: Diagnosis: NEGATIVE

## 2024-02-25 NOTE — Telephone Encounter (Signed)
 Attempted to reach pt twice. Phone is not ringing at both times. Will try again.

## 2024-02-26 ENCOUNTER — Encounter: Payer: Self-pay | Admitting: Obstetrics and Gynecology

## 2024-02-26 NOTE — Telephone Encounter (Signed)
 Attempted to reach pt. Left a voicemail to calll [place] back to schedule the lab appt.

## 2024-03-02 ENCOUNTER — Encounter: Payer: Self-pay | Admitting: Internal Medicine

## 2024-03-02 ENCOUNTER — Other Ambulatory Visit (INDEPENDENT_AMBULATORY_CARE_PROVIDER_SITE_OTHER)

## 2024-03-02 DIAGNOSIS — E7800 Pure hypercholesterolemia, unspecified: Secondary | ICD-10-CM

## 2024-03-02 DIAGNOSIS — R739 Hyperglycemia, unspecified: Secondary | ICD-10-CM

## 2024-03-02 DIAGNOSIS — Z79899 Other long term (current) drug therapy: Secondary | ICD-10-CM | POA: Diagnosis not present

## 2024-03-02 DIAGNOSIS — Z0000 Encounter for general adult medical examination without abnormal findings: Secondary | ICD-10-CM | POA: Diagnosis not present

## 2024-03-02 LAB — BASIC METABOLIC PANEL
BUN: 21 mg/dL (ref 6–23)
CO2: 30 meq/L (ref 19–32)
Calcium: 9.5 mg/dL (ref 8.4–10.5)
Chloride: 105 meq/L (ref 96–112)
Creatinine, Ser: 0.89 mg/dL (ref 0.40–1.20)
GFR: 68.74 mL/min (ref >60.00)
Glucose, Bld: 96 mg/dL (ref 70–99)
Potassium: 4.5 meq/L (ref 3.5–5.1)
Sodium: 140 meq/L (ref 135–145)

## 2024-03-02 LAB — CBC WITH DIFFERENTIAL/PLATELET
Basophils Absolute: 0 10*3/uL (ref 0.0–0.1)
Basophils Relative: 0.5 % (ref 0.0–3.0)
Eosinophils Absolute: 0.1 10*3/uL (ref 0.0–0.7)
Eosinophils Relative: 1.8 % (ref 0.0–5.0)
HCT: 43.7 % (ref 36.0–46.0)
Hemoglobin: 14.2 g/dL (ref 12.0–15.0)
Lymphocytes Relative: 22.7 % (ref 12.0–46.0)
Lymphs Abs: 1.4 10*3/uL (ref 0.7–4.0)
MCHC: 32.6 g/dL (ref 30.0–36.0)
MCV: 91.4 fl (ref 78.0–100.0)
Monocytes Absolute: 0.4 10*3/uL (ref 0.1–1.0)
Monocytes Relative: 7 % (ref 3.0–12.0)
Neutro Abs: 4.1 10*3/uL (ref 1.4–7.7)
Neutrophils Relative %: 68 % (ref 43.0–77.0)
Platelets: 219 10*3/uL (ref 150.0–400.0)
RBC: 4.78 Mil/uL (ref 3.87–5.11)
RDW: 13.5 % (ref 11.5–15.5)
WBC: 6 10*3/uL (ref 4.0–10.5)

## 2024-03-02 LAB — HEPATIC FUNCTION PANEL
ALT: 15 U/L (ref 0–35)
AST: 21 U/L (ref 0–37)
Albumin: 4.2 g/dL (ref 3.5–5.2)
Alkaline Phosphatase: 66 U/L (ref 39–117)
Bilirubin, Direct: 0.1 mg/dL (ref 0.0–0.3)
Total Bilirubin: 0.9 mg/dL (ref 0.2–1.2)
Total Protein: 7 g/dL (ref 6.0–8.3)

## 2024-03-02 LAB — MICROALBUMIN / CREATININE URINE RATIO
Creatinine,U: 100.4 mg/dL
Microalb Creat Ratio: UNDETERMINED mg/g (ref 0.0–30.0)
Microalb, Ur: <0.7 mg/dL

## 2024-03-02 LAB — LIPID PANEL
Cholesterol: 187 mg/dL (ref 0–200)
HDL: 75 mg/dL (ref >39.00)
LDL Cholesterol: 97 mg/dL (ref 0–99)
NonHDL: 111.61
Total CHOL/HDL Ratio: 2
Triglycerides: 75 mg/dL (ref 0.0–149.0)
VLDL: 15 mg/dL (ref 0.0–40.0)

## 2024-03-02 LAB — TSH: TSH: 1.4 u[IU]/mL (ref 0.35–5.50)

## 2024-03-02 LAB — HEMOGLOBIN A1C: Hgb A1c MFr Bld: 5.6 % (ref 4.6–6.5)

## 2024-03-02 NOTE — Progress Notes (Signed)
 Results in range   will review  at upcoming visit in April.

## 2024-03-07 ENCOUNTER — Ambulatory Visit (HOSPITAL_BASED_OUTPATIENT_CLINIC_OR_DEPARTMENT_OTHER)
Admission: RE | Admit: 2024-03-07 | Discharge: 2024-03-07 | Disposition: A | Discharge status: Home / Self Care | Source: Ambulatory Visit | Attending: Obstetrics and Gynecology | Admitting: Obstetrics and Gynecology

## 2024-03-07 ENCOUNTER — Encounter: Payer: Self-pay | Admitting: Obstetrics and Gynecology

## 2024-03-07 DIAGNOSIS — Z01419 Encounter for gynecological examination (general) (routine) without abnormal findings: Secondary | ICD-10-CM | POA: Diagnosis not present

## 2024-03-07 DIAGNOSIS — E2839 Other primary ovarian failure: Secondary | ICD-10-CM | POA: Diagnosis not present

## 2024-03-15 DIAGNOSIS — Z1211 Encounter for screening for malignant neoplasm of colon: Secondary | ICD-10-CM | POA: Diagnosis not present

## 2024-03-15 NOTE — Telephone Encounter (Signed)
 Called dermatology who stated would be best if patient reached out to them.  Called patient and lvm to inform.

## 2024-03-15 NOTE — Progress Notes (Unsigned)
 No chief complaint on file.   HPI: Patient  Deborah Lang  63 y.o. comes in today for Preventive Health Care visit   Health Maintenance  Topic Date Due   Colonoscopy  06/10/2021   COVID-19 Vaccine (4 - 2024-25 season) 08/16/2023   INFLUENZA VACCINE  07/15/2024   MAMMOGRAM  01/21/2026   Cervical Cancer Screening (HPV/Pap Cotest)  02/22/2029   DTaP/Tdap/Td (3 - Td or Tdap) 02/25/2033   Hepatitis C Screening  Completed   HIV Screening  Completed   Zoster Vaccines- Shingrix  Completed   HPV VACCINES  Aged Out   Health Maintenance Review LIFESTYLE:  Exercise:   Tobacco/ETS: Alcohol:  Sugar beverages: Sleep: Drug use: no HH of  Work:    ROS:  GEN/ HEENT: No fever, significant weight changes sweats headaches vision problems hearing changes, CV/ PULM; No chest pain shortness of breath cough, syncope,edema  change in exercise tolerance. GI /GU: No adominal pain, vomiting, change in bowel habits. No blood in the stool. No significant GU symptoms. SKIN/HEME: ,no acute skin rashes suspicious lesions or bleeding. No lymphadenopathy, nodules, masses.  NEURO/ PSYCH:  No neurologic signs such as weakness numbness. No depression anxiety. IMM/ Allergy: No unusual infections.  Allergy .   REST of 12 system review negative except as per HPI   Past Medical History:  Diagnosis Date   Abnormal Pap smear    Chicken pox    GERD (gastroesophageal reflux disease)    HX: benign breast biopsy 2002    Past Surgical History:  Procedure Laterality Date   BREAST EXCISIONAL BIOPSY Left 08/30/2001   BREAST SURGERY  2001   benign tumor removed left breast   CERVICAL BIOPSY  W/ LOOP ELECTRODE EXCISION  06/2001   CIN III   COLPOSCOPY  07-14-2001   CIN III   COLPOSCOPY  1996   dysplasia--colposcopy normal   LASIK  2002   WISDOM TOOTH EXTRACTION     WISDOM TOOTH EXTRACTION  04/2017    Family History  Problem Relation Age of Onset   Osteoarthritis Mother    Stroke Father 74    Arthritis Father    Breast cancer Maternal Aunt 6 - 58    Social History   Socioeconomic History   Marital status: Single    Spouse name: Not on file   Number of children: Not on file   Years of education: Not on file   Highest education level: Not on file  Occupational History   Not on file  Tobacco Use   Smoking status: Never   Smokeless tobacco: Never  Vaping Use   Vaping status: Never Used  Substance and Sexual Activity   Alcohol use: Yes    Comment: beer once a month   Drug use: No   Sexual activity: Not Currently    Partners: Male    Birth control/protection: Post-menopausal  Other Topics Concern   Not on file  Social History Narrative   7 hours of sleep per night   Lives alone single   One house cat   No tobacco   ocass  etoh   gyme 2-3 x per week    Yoga    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   Masters level educ   G0P0   orig from [place]    Social Drivers of [employer] Strain: Not on [employer] Insecurity: Not on file  Transportation Needs: Not on file  Physical Activity: Not on  file  Stress: Not on file  Social Connections: Not on file    Outpatient Medications Prior to Visit  Medication Sig Dispense Refill   Omega-3 Fatty Acids (FISH OIL PO) Take by mouth.     No facility-administered medications prior to visit.     EXAM:  LMP 10/15/2014 (Approximate)   There is no height or weight on file to calculate BMI. Wt Readings from Last 3 Encounters:  02/23/24 163 lb (73.9 kg)  02/26/23 160 lb 3.2 oz (72.7 kg)  02/18/23 160 lb (72.6 kg)    Physical Exam: Vital signs reviewed OZH:YQMV is a well-developed well-nourished alert cooperative    who appearsr stated age in no acute distress.  HEENT: normocephalic atraumatic , Eyes: PERRL EOM's full, conjunctiva clear, Nares: paten,t no deformity discharge or tenderness., Ears: no deformity EAC's clear TMs with normal landmarks. Mouth: clear OP, no lesions, edema.   Moist mucous membranes. Dentition in adequate repair. NECK: supple without masses, thyromegaly or bruits. CHEST/PULM:  Clear to auscultation and percussion breath sounds equal no wheeze , rales or rhonchi. No chest wall deformities or tenderness. Breast: normal by inspection . No dimpling, discharge, masses, tenderness or discharge . CV: PMI is nondisplaced, S1 S2 no gallops, murmurs, rubs. Peripheral pulses are full without delay.No JVD .  ABDOMEN: Bowel sounds normal nontender  No guard or rebound, no hepato splenomegal no CVA tenderness.  No hernia. Extremtities:  No clubbing cyanosis or edema, no acute joint swelling or redness no focal atrophy NEURO:  Oriented x3, cranial nerves 3-12 appear to be intact, no obvious focal weakness,gait within normal limits no abnormal reflexes or asymmetrical SKIN: No acute rashes normal turgor, color, no bruising or petechiae. PSYCH: Oriented, good eye contact, no obvious depression anxiety, cognition and judgment appear normal. LN: no cervical axillary inguinal adenopathy  Lab Results  Component Value Date   WBC 6.0 03/02/2024   HGB 14.2 03/02/2024   HCT 43.7 03/02/2024   PLT 219.0 03/02/2024   GLUCOSE 96 03/02/2024   CHOL 187 03/02/2024   TRIG 75.0 03/02/2024   HDL 75.00 03/02/2024   LDLCALC 97 03/02/2024   ALT 15 03/02/2024   AST 21 03/02/2024   NA 140 03/02/2024   K 4.5 03/02/2024   CL 105 03/02/2024   CREATININE 0.89 03/02/2024   BUN 21 03/02/2024   CO2 30 03/02/2024   TSH 1.40 03/02/2024   HGBA1C 5.6 03/02/2024   MICROALBUR <0.7 03/02/2024    BP Readings from Last 3 Encounters:  02/23/24 110/74  02/26/23 102/72  02/18/23 128/64    Lab results reviewed with patient   ASSESSMENT AND PLAN:  Discussed the following assessment and plan:  No diagnosis found. No follow-ups on file.  Patient Care Team: Corynne Scibilia, Neta Mends, MD as PCP - General (Internal Medicine) Patton Salles, MD as Consulting Physician (Obstetrics and  Gynecology) Nelson Chimes, MD as Consulting Physician (Ophthalmology) Charna Elizabeth, MD as Consulting Physician (Gastroenterology) There are no Patient Instructions on file for this visit.  Neta Mends. Leonardo Makris M.D.

## 2024-03-15 NOTE — Telephone Encounter (Signed)
 Deborah Lang -can you f/u on dermatology referral.

## 2024-03-16 ENCOUNTER — Ambulatory Visit (INDEPENDENT_AMBULATORY_CARE_PROVIDER_SITE_OTHER): Payer: 59 | Admitting: Internal Medicine

## 2024-03-16 ENCOUNTER — Encounter: Payer: Self-pay | Admitting: Internal Medicine

## 2024-03-16 VITALS — BP 108/76 | HR 76 | Temp 98.2°F | Ht 67.0 in | Wt 163.2 lb

## 2024-03-16 DIAGNOSIS — L819 Disorder of pigmentation, unspecified: Secondary | ICD-10-CM

## 2024-03-16 DIAGNOSIS — Z0000 Encounter for general adult medical examination without abnormal findings: Secondary | ICD-10-CM

## 2024-03-16 NOTE — Patient Instructions (Addendum)
   Good to see you today  The area on face may be ok but agree should get  checked

## 2024-03-21 LAB — COLOGUARD: COLOGUARD: NEGATIVE

## 2024-04-05 ENCOUNTER — Encounter: Admitting: Internal Medicine

## 2024-05-10 ENCOUNTER — Encounter: Payer: Self-pay | Admitting: Dermatology

## 2024-05-10 ENCOUNTER — Ambulatory Visit: Admitting: Dermatology

## 2024-05-10 VITALS — BP 122/80 | HR 66

## 2024-05-10 DIAGNOSIS — L821 Other seborrheic keratosis: Secondary | ICD-10-CM | POA: Diagnosis not present

## 2024-05-10 DIAGNOSIS — L570 Actinic keratosis: Secondary | ICD-10-CM

## 2024-05-10 MED ORDER — FLUOROURACIL 5 % EX CREA: TOPICAL_CREAM | Freq: Two times a day (BID) | CUTANEOUS | 0 refills | Status: DC

## 2024-05-10 NOTE — Progress Notes (Signed)
   New Patient Visit   Subjective  Deborah Lang is a 63 y.o. female who presents for the following: Spots of face and arms. Present for months, itchy and gritty, not previously treated.  The following portions of the chart were reviewed this encounter and updated as appropriate: medications, allergies, medical history  Review of Systems:  No other skin or systemic complaints except as noted in HPI or Assessment and Plan.  Objective  Well appearing patient in no apparent distress; mood and affect are within normal limits.  A focused examination was performed of the following areas: Face  Relevant exam findings are noted in the Assessment and Plan.  Head - Anterior (Face) Erythematous thin papules/macules with gritty scale.   Assessment & Plan   SEBORRHEIC KERATOSIS - Stuck-on, waxy, tan-brown papules of face and chest. - Benign-appearing - Discussed benign etiology and prognosis. - Observe - Call for any changes  ACTINIC KERATOSIS Exam: Erythematous thin papules/macules with gritty scale at the central face and nose  Actinic keratoses are precancerous spots that appear secondary to cumulative UV radiation exposure/sun exposure over time. They are chronic with expected duration over 1 year. A portion of actinic keratoses will progress to squamous cell carcinoma of the skin. It is not possible to reliably predict which spots will progress to skin cancer and so treatment is recommended to prevent development of skin cancer.  Recommend daily broad spectrum sunscreen SPF 30+ to sun-exposed areas, reapply every 2 hours as needed.  Recommend staying in the shade or wearing long sleeves, sun glasses (UVA+UVB protection) and wide brim hats (4-inch brim around the entire circumference of the hat). Call for new or changing lesions.  Treatment Plan: Start 5-fluorouracil cream twice a day for 14 days to affected areas including central face and nose.  Reviewed course of treatment and  expected reaction.  Patient advised to expect inflammation and crusting and advised that erosions are possible.  Patient advised to be diligent with sun protection during and after treatment. Handout with details of how to apply medication and what to expect provided. Counseled to keep medication out of reach of children and pets.  Reviewed course of treatment and expected reaction.  Patient advised to expect inflammation and crusting and advised that erosions are possible.  Patient advised to be diligent with sun protection during and after treatment. Handout with details of how to apply medication and what to expect provided. Counseled to keep medication out of reach of children and pets.  AK (ACTINIC KERATOSIS) Head - Anterior (Face) Start Fluorouracil 5% cream twice daily to nose and cheeks for 2 weeks. Advised patient she will be red and scaly while using the medication.  Return in about 2 months (around 07/10/2024) for AK follow up.  I, Eliot Guernsey, CMA, am acting as scribe for Deneise Finlay, MD .   Documentation: I have reviewed the above documentation for accuracy and completeness, and I agree with the above.  Deneise Finlay, MD

## 2024-05-10 NOTE — Patient Instructions (Signed)
 5-Fluorouracil Patient Education   Actinic keratoses are the dry, red scaly spots on the skin caused by sun damage. A portion of these spots can turn into skin cancer with time, and treating them can help prevent development of skin cancer.   Treatment of these spots requires removal of the defective skin cells. There are various ways to remove actinic keratoses, including freezing with liquid nitrogen, treatment with creams, or treatment with a blue light procedure in the office.   5-fluorouracil cream is a topical cream used to treat actinic keratoses. It works by interfering with the growth of abnormal fast-growing skin cells, such as actinic keratoses. These cells peel off and are replaced by healthy ones. THIS CREAM SHOULD BE KEPT OUT OF REACH OF CHILDREN AND PETS AND SHOULD NOT BE USED BY PREGNANT WOMEN.  INSTRUCTIONS FOR 5-FLUOROURACIL CREAM:   5-fluorouracil cream typically needs to be used for 7-14 days depending on the location of treatment. A thin layer should be applied twice a day to the treatment areas recommended by your physician.    Avoid contact with your eyes or nostrils. Avoid applying the cream to your eyelids or lips unless directed to apply there by your physician. Do not use 5-fluorouracil on infected or open wounds.   You will develop redness, irritation and some crusting at areas where you have pre-cancer damage/actinic keratoses. IF YOU DEVELOP PAIN, BLEEDING, OR SIGNIFICANT CRUSTING, STOP THE TREATMENT EARLY - you have already gotten a good response and the actinic keratoses should clear up well.  Wash your hands after applying 5-fluorouracil 5% cream on your skin.   A moisturizer or sunscreen with a minimum SPF 30 should be applied each morning.   Once you have finished the treatment, you can apply a thin layer of Vaseline twice a day to irritated areas to soothe and calm the areas more quickly. If you experience significant discomfort, contact your physician.  For  some patients it is necessary to repeat the treatment for best results.  SIDE EFFECTS: When using 5-fluorouracil cream, you may have mild irritation, such as redness, dryness, swelling, or a mild burning sensation. This usually resolves within 2 weeks. The more actinic keratoses you have, the more redness and inflammation you can expect during treatment. Eye irritation has been reported rarely. If this occurs, please let us  know.   If you have any trouble using this cream, please send us  a MyChart Message or call the office. If you have any other questions about this information, please do not hesitate to ask me before you leave the office or reach out on MyChart or by phone.     Important Information  Due to recent changes in healthcare laws, you may see results of your pathology and/or laboratory studies on MyChart before the doctors have had a chance to review them. We understand that in some cases there may be results that are confusing or concerning to you. Please understand that not all results are received at the same time and often the doctors may need to interpret multiple results in order to provide you with the best plan of care or course of treatment. Therefore, we ask that you please give us  2 business days to thoroughly review all your results before contacting the office for clarification. Should we see a critical lab result, you will be contacted sooner.   If You Need Anything After Your Visit  If you have any questions or concerns for your doctor, please call our main line at  [phone] If no one answers, please leave a voicemail as directed and we will return your call as soon as possible. Messages left after 4 pm will be answered the following business day.   You may also send us  a message via MyChart. We typically respond to MyChart messages within 1-2 business days.  For prescription refills, please ask your pharmacy to contact our office. Our fax number is  [phone].  If you have an urgent issue when the clinic is closed that cannot wait until the next business day, you can page your doctor at the number below.    Please note that while we do our best to be available for urgent issues outside of office hours, we are not available 24/7.   If you have an urgent issue and are unable to reach us , you may choose to seek medical care at your doctor's office, retail clinic, urgent care center, or emergency room.  If you have a medical emergency, please immediately call 911 or go to the emergency department. In the event of inclement weather, please call our main line at [phone] for an update on the status of any delays or closures.  Dermatology Medication Tips: Please keep the boxes that topical medications come in in order to help keep track of the instructions about where and how to use these. Pharmacies typically print the medication instructions only on the boxes and not directly on the medication tubes.   If your medication is too expensive, please contact our office at [phone] or send us  a message through MyChart.   We are unable to tell what your co-pay for medications will be in advance as this is different depending on your insurance coverage. However, we may be able to find a substitute medication at lower cost or fill out paperwork to get insurance to cover a needed medication.   If a prior authorization is required to get your medication covered by your insurance company, please allow us  1-2 business days to complete this process.  Drug prices often vary depending on where the prescription is filled and some pharmacies may offer cheaper prices.  The website www.goodrx.com contains coupons for medications through different pharmacies. The prices here do not account for what the cost may be with help from insurance (it may be cheaper with your insurance), but the website can give you the price if you did not use any insurance.  -  You can print the associated coupon and take it with your prescription to the pharmacy.  - You may also stop by our office during regular business hours and pick up a GoodRx coupon card.  - If you need your prescription sent electronically to a different pharmacy, notify our office through [facility] or by phone at [phone]

## 2024-07-11 ENCOUNTER — Encounter: Payer: Self-pay | Admitting: Dermatology

## 2024-07-11 ENCOUNTER — Ambulatory Visit: Admitting: Dermatology

## 2024-07-11 DIAGNOSIS — L570 Actinic keratosis: Secondary | ICD-10-CM | POA: Diagnosis not present

## 2024-07-11 NOTE — Progress Notes (Signed)
   Follow-Up Visit   Subjective  Deborah Lang is a 63 y.o. female who presents for the following: Here to follow up after treatment of Aks. She treated her central face with topical 5fU and had a substantial reaction.   The following portions of the chart were reviewed this encounter and updated as appropriate: medications, allergies, medical history  Review of Systems:  No other skin or systemic complaints except as noted in HPI or Assessment and Plan.  Objective  Well appearing patient in no apparent distress; mood and affect are within normal limits.  A focused examination was performed of the following areas: Face  Relevant exam findings are noted in the Assessment and Plan.    Assessment & Plan  ACTINIC KERATOSIS Exam: Erythematous thin papules/macules with gritty scale at the Central face and nose, much improved  Actinic keratoses are precancerous spots that appear secondary to cumulative UV radiation exposure/sun exposure over time. They are chronic with expected duration over 1 year. A portion of actinic keratoses will progress to squamous cell carcinoma of the skin. It is not possible to reliably predict which spots will progress to skin cancer and so treatment is recommended to prevent development of skin cancer.  Recommend daily broad spectrum sunscreen SPF 30+ to sun-exposed areas, reapply every 2 hours as needed.  Recommend staying in the shade or wearing long sleeves, sun glasses (UVA+UVB protection) and wide brim hats (4-inch brim around the entire circumference of the hat). Call for new or changing lesions.  Treatment Plan: Patient finished a two week twice a day course of Efudex  to central face and nose. Patient reacted well to cream. Patent finished treatment on May 29, 2024     Return in about 10 months (around 05/11/2025) for TBSC.  I, Berwyn Lesches, Surg Tech III, am acting as scribe for RUFUS CHRISTELLA HOLY, MD.   Documentation: I have reviewed the above  documentation for accuracy and completeness, and I agree with the above.  RUFUS CHRISTELLA HOLY, MD

## 2024-07-11 NOTE — Patient Instructions (Signed)

## 2025-02-23 ENCOUNTER — Ambulatory Visit: Admitting: Obstetrics and Gynecology

## 2025-03-30 DIAGNOSIS — Z1231 Encounter for screening mammogram for malignant neoplasm of breast: Secondary | ICD-10-CM

## 2025-05-11 ENCOUNTER — Ambulatory Visit: Admitting: Dermatology
